# Patient Record
Sex: Female | Born: 1945 | Race: White | Hispanic: No | Smoking: Former smoker
Health system: Southern US, Community
[De-identification: ages and names within clinical notes are randomized; demographics above are authoritative.]

## PROBLEM LIST (undated history)

## (undated) DIAGNOSIS — J189 Pneumonia, unspecified organism: Secondary | ICD-10-CM

## (undated) DIAGNOSIS — Z860100 Personal history of colon polyps, unspecified: Secondary | ICD-10-CM

## (undated) DIAGNOSIS — R531 Weakness: Secondary | ICD-10-CM

## (undated) DIAGNOSIS — G8929 Other chronic pain: Secondary | ICD-10-CM

## (undated) DIAGNOSIS — K219 Gastro-esophageal reflux disease without esophagitis: Secondary | ICD-10-CM

## (undated) DIAGNOSIS — R0600 Dyspnea, unspecified: Secondary | ICD-10-CM

## (undated) DIAGNOSIS — Z9889 Other specified postprocedural states: Secondary | ICD-10-CM

## (undated) DIAGNOSIS — M1990 Unspecified osteoarthritis, unspecified site: Secondary | ICD-10-CM

## (undated) DIAGNOSIS — L853 Xerosis cutis: Secondary | ICD-10-CM

## (undated) DIAGNOSIS — M2550 Pain in unspecified joint: Secondary | ICD-10-CM

## (undated) DIAGNOSIS — R112 Nausea with vomiting, unspecified: Secondary | ICD-10-CM

## (undated) DIAGNOSIS — Z9289 Personal history of other medical treatment: Secondary | ICD-10-CM

## (undated) DIAGNOSIS — I499 Cardiac arrhythmia, unspecified: Secondary | ICD-10-CM

## (undated) DIAGNOSIS — I4891 Unspecified atrial fibrillation: Secondary | ICD-10-CM

## (undated) DIAGNOSIS — M549 Dorsalgia, unspecified: Secondary | ICD-10-CM

## (undated) DIAGNOSIS — R351 Nocturia: Secondary | ICD-10-CM

## (undated) HISTORY — PX: OTHER SURGICAL HISTORY: SHX169

## (undated) HISTORY — PX: COLONOSCOPY: SHX174

## (undated) HISTORY — PX: HIP SURGERY: SHX245

## (undated) HISTORY — PX: CHOLECYSTECTOMY: SHX55

## (undated) HISTORY — PX: ESOPHAGOGASTRODUODENOSCOPY: SHX1529

---

## 1997-06-20 ENCOUNTER — Other Ambulatory Visit: Admission: RE | Admit: 1997-06-20 | Discharge: 1997-06-20 | Payer: Self-pay | Admitting: *Deleted

## 1998-12-06 ENCOUNTER — Encounter: Payer: Self-pay | Admitting: Obstetrics and Gynecology

## 1998-12-06 ENCOUNTER — Encounter: Admission: RE | Admit: 1998-12-06 | Discharge: 1998-12-06 | Payer: Self-pay | Admitting: Obstetrics and Gynecology

## 1999-01-08 ENCOUNTER — Other Ambulatory Visit: Admission: RE | Admit: 1999-01-08 | Discharge: 1999-01-08 | Payer: Self-pay | Admitting: *Deleted

## 1999-02-18 ENCOUNTER — Encounter: Admission: RE | Admit: 1999-02-18 | Discharge: 1999-02-18 | Payer: Self-pay | Admitting: Obstetrics and Gynecology

## 1999-02-18 ENCOUNTER — Encounter: Payer: Self-pay | Admitting: Obstetrics and Gynecology

## 2000-09-17 ENCOUNTER — Encounter: Admission: RE | Admit: 2000-09-17 | Discharge: 2000-09-17 | Payer: Self-pay | Admitting: Gastroenterology

## 2000-09-17 ENCOUNTER — Encounter: Payer: Self-pay | Admitting: Gastroenterology

## 2000-11-17 ENCOUNTER — Encounter: Admission: RE | Admit: 2000-11-17 | Discharge: 2000-11-17 | Payer: Self-pay | Admitting: Internal Medicine

## 2000-11-17 ENCOUNTER — Encounter: Payer: Self-pay | Admitting: Internal Medicine

## 2001-11-23 ENCOUNTER — Encounter: Payer: Self-pay | Admitting: Internal Medicine

## 2001-11-23 ENCOUNTER — Encounter: Admission: RE | Admit: 2001-11-23 | Discharge: 2001-11-23 | Payer: Self-pay | Admitting: Internal Medicine

## 2001-12-09 ENCOUNTER — Other Ambulatory Visit: Admission: RE | Admit: 2001-12-09 | Discharge: 2001-12-09 | Payer: Self-pay | Admitting: Internal Medicine

## 2002-02-17 ENCOUNTER — Encounter (INDEPENDENT_AMBULATORY_CARE_PROVIDER_SITE_OTHER): Payer: Self-pay | Admitting: *Deleted

## 2002-02-17 ENCOUNTER — Ambulatory Visit (HOSPITAL_BASED_OUTPATIENT_CLINIC_OR_DEPARTMENT_OTHER): Admission: RE | Admit: 2002-02-17 | Discharge: 2002-02-17 | Payer: Self-pay | Admitting: General Surgery

## 2002-11-29 ENCOUNTER — Encounter: Admission: RE | Admit: 2002-11-29 | Discharge: 2002-11-29 | Payer: Self-pay | Admitting: Internal Medicine

## 2003-04-20 ENCOUNTER — Emergency Department (HOSPITAL_COMMUNITY): Admission: EM | Admit: 2003-04-20 | Discharge: 2003-04-20 | Payer: Self-pay | Admitting: Emergency Medicine

## 2003-05-01 ENCOUNTER — Emergency Department (HOSPITAL_COMMUNITY): Admission: EM | Admit: 2003-05-01 | Discharge: 2003-05-01 | Payer: Self-pay | Admitting: Emergency Medicine

## 2003-11-30 ENCOUNTER — Encounter: Admission: RE | Admit: 2003-11-30 | Discharge: 2003-11-30 | Payer: Self-pay | Admitting: Internal Medicine

## 2004-08-13 ENCOUNTER — Other Ambulatory Visit: Admission: RE | Admit: 2004-08-13 | Discharge: 2004-08-13 | Payer: Self-pay | Admitting: Internal Medicine

## 2004-09-23 ENCOUNTER — Ambulatory Visit (HOSPITAL_COMMUNITY): Admission: RE | Admit: 2004-09-23 | Discharge: 2004-09-24 | Payer: Self-pay | Admitting: Surgery

## 2004-09-23 ENCOUNTER — Encounter (INDEPENDENT_AMBULATORY_CARE_PROVIDER_SITE_OTHER): Payer: Self-pay | Admitting: Specialist

## 2004-11-19 ENCOUNTER — Other Ambulatory Visit: Admission: RE | Admit: 2004-11-19 | Discharge: 2004-11-19 | Payer: Self-pay | Admitting: Internal Medicine

## 2004-12-03 ENCOUNTER — Encounter: Admission: RE | Admit: 2004-12-03 | Discharge: 2004-12-03 | Payer: Self-pay | Admitting: Internal Medicine

## 2006-11-04 ENCOUNTER — Encounter: Admission: RE | Admit: 2006-11-04 | Discharge: 2006-11-04 | Payer: Self-pay | Admitting: Hospitalist

## 2007-11-11 ENCOUNTER — Ambulatory Visit (HOSPITAL_BASED_OUTPATIENT_CLINIC_OR_DEPARTMENT_OTHER): Admission: RE | Admit: 2007-11-11 | Discharge: 2007-11-11 | Payer: Self-pay | Admitting: Internal Medicine

## 2008-11-09 ENCOUNTER — Ambulatory Visit (HOSPITAL_BASED_OUTPATIENT_CLINIC_OR_DEPARTMENT_OTHER): Admission: RE | Admit: 2008-11-09 | Discharge: 2008-11-09 | Payer: Self-pay | Admitting: Internal Medicine

## 2008-11-09 ENCOUNTER — Ambulatory Visit: Payer: Self-pay | Admitting: Radiology

## 2009-11-27 ENCOUNTER — Ambulatory Visit (HOSPITAL_BASED_OUTPATIENT_CLINIC_OR_DEPARTMENT_OTHER): Admission: RE | Admit: 2009-11-27 | Discharge: 2009-11-27 | Payer: Self-pay | Admitting: Internal Medicine

## 2009-11-27 ENCOUNTER — Ambulatory Visit: Payer: Self-pay | Admitting: Diagnostic Radiology

## 2010-02-23 ENCOUNTER — Encounter: Payer: Self-pay | Admitting: Internal Medicine

## 2010-06-21 NOTE — Op Note (Signed)
   NAME:  [name] Dodson, [name] Dodson                            ACCOUNT NO.:  [id number]   MEDICAL RECORD NO.:  [id number]                   PATIENT TYPE:  AMB   LOCATION:  DSC                                  FACILITY:  [facility]   PHYSICIAN:  Ollen Gross. Vernell Morgans, M.D.              DATE OF BIRTH:  1945-12-27   DATE OF PROCEDURE:  02/17/2002  DATE OF DISCHARGE:                                 OPERATIVE REPORT   PREOPERATIVE DIAGNOSES:  Lipoma of the right low back.   POSTOPERATIVE DIAGNOSES:  Lipoma of the right low back.   OPERATION PERFORMED:  Excision of lipoma from the right low back 3 cm  approximately.   SURGEON:  Ollen Gross. Carolynne Edouard, M.D.   ANESTHESIA:  Local.   DESCRIPTION OF PROCEDURE:  After informed consent was obtained, the patient  was brought to the operating room and placed in prone position on the  operating able.  The area in question was prepped with Betadine and draped  in the usual sterile manner.  The area was then infiltrated with 1%  lidocaine with epinephrine until adequate anesthesia had been obtained.  A  small transverse incision was made overlying the mass.  This incision was  carried down into the subcutaneous tissue sharply with a 10 blade knife.  Blunt dissection with a hemostat and sharp dissection with a scissors was  then carried out until the mass in question was separated from the rest of  the subcutaneous fat.  The mass was then removed and sent to pathology.  The  deep layer of the wound was then examined and found to be hemostatic.  The  deep layer was closed with interrupted 3-0 Vicryl stitches.  The skin was  closed with a running 4-0 Monocryl subcuticular stitch.  Benzoin, Steri-  Strips and sterile dressings were applied.  The patient tolerated the  procedure well.  At the end of the case all sponge, needle and instrument  counts were correct.  The patient was then taken to the recovery room in  stable condition.           Ollen Gross. Vernell Morgans, M.D.    PST/MEDQ  D:  02/17/2002  T:  02/17/2002  Job:  [id number]

## 2010-06-21 NOTE — Op Note (Signed)
NAME:  [name] Dodson, [name] Dodson                  ACCOUNT NO.:  [id number]   MEDICAL RECORD NO.:  [id number]          PATIENT TYPE:  AMB   LOCATION:  DAY                          [facility]ACILITY:  [facility]   PHYSICIAN:  Currie Paris, M.D.DATE OF BIRTH:  Aug 16, 1945   DATE OF PROCEDURE:  09/23/2004  DATE OF DISCHARGE:                                 OPERATIVE REPORT   CCS#:  [id number].   PREOPERATIVE DIAGNOSIS:  Chronic calculus cholecystitis.   POSTOPERATIVE DIAGNOSIS:  Chronic calculus cholecystitis.   OPERATION:  Laparoscopic cholecystectomy with operative cholangiogram.   SURGEON:  Dr. Cyndia Bent.   ASSISTANT:  Dr. Velora Heckler.   ANESTHESIA:  General endotracheal.   CLINICAL HISTORY:  This is a 65 year old lady who has got biliary type  symptoms and known gallstones. She elected to proceed to cholecystectomy.   DESCRIPTION OF PROCEDURE:  The patient was seen in the holding area and she  had no further questions. She was taken to the operating room and the  abdomen was prepped and draped. The time-out occurred.   The area of the umbilicus was infiltrated with 0.25% plain Marcaine,  incision made, the fascia identified, opened and the peroneal cavity entered  under direct vision. A pursestring was placed, the Hasson introduced and the  abdomen insufflated to 15.   With the patient in reverse Trendelenburg and tilted to the left, additional  trocars placed in the epigastrium and right upper quadrant.   There were multiple omental adhesions to the entire length of the  gallbladder and these were taken down with a combination of sharp and  cautery dissection. Once I got down to the ampulla of the gallbladder with  some lateral retraction, I was able to dissect out and identified the cystic  duct which was about a diameter of a clip, a little larger than normal. To  make sure we had in the anatomy correctly identified, I opened up the  peritoneum going on either side of the gallbladder.  We identified the  posterior branch of the cystic artery which was clipped at this point and  made a large window to make sure that there were no other structures.   Once the anatomy appeared to be clear, I put a clip across the cystic duct  at the gallbladder and opened it. A Cook catheter was introduced and held  with a clip although there was a little bit of leakage around it.   We did several cholangiograms. Initially we did not see much contrast go  into to the duodenum bit after some glucagon saw filling of the duodenum. We  had what looked like a long cystic duct segment, filling of the hepatic  radicals and no obvious stones.   The catheter was removed and three clips placed across the cystic duct  taking care to that they completely crossed the duct and it was divided.  Another branch of the cystic artery was identified, clipped and divided. The  gallbladder was removed from below to above with an additional clip used on  a small blood vessel in the bed  of the gallbladder.   Once the gallbladder was disconnected, it was put in a bag. We spent several  minutes irrigating making sure everything was completely dry. Once we  thought hemostasis was achieved, brought the gallbladder out the umbilical  port. The port was irrigated for a final irrigation and hemostasis check and  again everything appeared to be dry. The lateral ports removed under direct  vision and there was no bleeding. The umbilical site was closed with a  pursestring. The abdomen was deflated through the epigastric site. The skin  was closed with 4-0 Monocryl, subcuticular and Dermabond.   The patient tolerated the procedure well. There were no operative  complications. All counts were correct.      Currie Paris, M.D.  Electronically Signed     CJS/MEDQ  D:  09/23/2004  T:  09/23/2004  Job:  [id number]   cc:   Sharlet Salina, M.D.  [address]  [place]  [place] [address]  Fax: [phone]

## 2010-10-24 ENCOUNTER — Other Ambulatory Visit (HOSPITAL_BASED_OUTPATIENT_CLINIC_OR_DEPARTMENT_OTHER): Payer: Self-pay | Admitting: Internal Medicine

## 2010-10-24 DIAGNOSIS — Z1231 Encounter for screening mammogram for malignant neoplasm of breast: Secondary | ICD-10-CM

## 2010-11-29 ENCOUNTER — Ambulatory Visit (HOSPITAL_BASED_OUTPATIENT_CLINIC_OR_DEPARTMENT_OTHER): Payer: Self-pay

## 2010-12-11 ENCOUNTER — Ambulatory Visit (HOSPITAL_BASED_OUTPATIENT_CLINIC_OR_DEPARTMENT_OTHER)
Admission: RE | Admit: 2010-12-11 | Discharge: 2010-12-11 | Disposition: A | Discharge status: Home / Self Care | Payer: Medicare Other | Source: Ambulatory Visit | Attending: Internal Medicine | Admitting: Internal Medicine

## 2010-12-11 DIAGNOSIS — Z1231 Encounter for screening mammogram for malignant neoplasm of breast: Secondary | ICD-10-CM

## 2011-11-18 ENCOUNTER — Other Ambulatory Visit (HOSPITAL_BASED_OUTPATIENT_CLINIC_OR_DEPARTMENT_OTHER): Payer: Self-pay | Admitting: *Deleted

## 2011-11-18 DIAGNOSIS — Z1231 Encounter for screening mammogram for malignant neoplasm of breast: Secondary | ICD-10-CM

## 2011-12-12 ENCOUNTER — Ambulatory Visit (HOSPITAL_BASED_OUTPATIENT_CLINIC_OR_DEPARTMENT_OTHER)
Admission: RE | Admit: 2011-12-12 | Discharge: 2011-12-12 | Disposition: A | Discharge status: Home / Self Care | Payer: Medicare Other | Source: Ambulatory Visit | Attending: Family Medicine | Admitting: Family Medicine

## 2011-12-12 DIAGNOSIS — R928 Other abnormal and inconclusive findings on diagnostic imaging of breast: Secondary | ICD-10-CM | POA: Insufficient documentation

## 2011-12-12 DIAGNOSIS — Z1231 Encounter for screening mammogram for malignant neoplasm of breast: Secondary | ICD-10-CM | POA: Insufficient documentation

## 2011-12-18 ENCOUNTER — Other Ambulatory Visit: Payer: Self-pay | Admitting: *Deleted

## 2011-12-18 DIAGNOSIS — R928 Other abnormal and inconclusive findings on diagnostic imaging of breast: Secondary | ICD-10-CM

## 2012-01-08 ENCOUNTER — Other Ambulatory Visit: Payer: Self-pay | Admitting: Family Medicine

## 2012-01-08 ENCOUNTER — Ambulatory Visit
Admission: RE | Admit: 2012-01-08 | Discharge: 2012-01-08 | Disposition: A | Discharge status: Home / Self Care | Payer: Medicare Other | Source: Ambulatory Visit | Attending: *Deleted | Admitting: *Deleted

## 2012-01-08 DIAGNOSIS — R928 Other abnormal and inconclusive findings on diagnostic imaging of breast: Secondary | ICD-10-CM

## 2012-01-08 DIAGNOSIS — R921 Mammographic calcification found on diagnostic imaging of breast: Secondary | ICD-10-CM

## 2012-01-12 ENCOUNTER — Ambulatory Visit
Admission: RE | Admit: 2012-01-12 | Discharge: 2012-01-12 | Disposition: A | Discharge status: Home / Self Care | Payer: Medicare Other | Source: Ambulatory Visit | Attending: Family Medicine | Admitting: Family Medicine

## 2012-01-12 ENCOUNTER — Other Ambulatory Visit: Payer: Self-pay | Admitting: Family Medicine

## 2012-01-12 DIAGNOSIS — R921 Mammographic calcification found on diagnostic imaging of breast: Secondary | ICD-10-CM

## 2013-06-02 ENCOUNTER — Other Ambulatory Visit: Payer: Self-pay

## 2013-06-02 DIAGNOSIS — Z1231 Encounter for screening mammogram for malignant neoplasm of breast: Secondary | ICD-10-CM

## 2013-06-13 ENCOUNTER — Ambulatory Visit
Admission: RE | Admit: 2013-06-13 | Discharge: 2013-06-13 | Disposition: A | Discharge status: Home / Self Care | Payer: Medicare Other | Source: Ambulatory Visit

## 2013-06-13 DIAGNOSIS — Z1231 Encounter for screening mammogram for malignant neoplasm of breast: Secondary | ICD-10-CM

## 2013-06-14 ENCOUNTER — Other Ambulatory Visit: Payer: Self-pay | Admitting: Family Medicine

## 2013-06-14 DIAGNOSIS — R928 Other abnormal and inconclusive findings on diagnostic imaging of breast: Secondary | ICD-10-CM

## 2013-06-24 ENCOUNTER — Other Ambulatory Visit: Payer: Medicare Other

## 2013-07-06 ENCOUNTER — Other Ambulatory Visit: Payer: Medicare Other

## 2013-07-11 ENCOUNTER — Other Ambulatory Visit: Payer: Self-pay | Admitting: Physical Medicine and Rehabilitation

## 2013-07-11 ENCOUNTER — Other Ambulatory Visit: Payer: Self-pay | Admitting: Family Medicine

## 2013-07-11 DIAGNOSIS — R928 Other abnormal and inconclusive findings on diagnostic imaging of breast: Secondary | ICD-10-CM

## 2013-07-27 ENCOUNTER — Other Ambulatory Visit: Payer: Self-pay | Admitting: Physical Medicine and Rehabilitation

## 2013-07-27 DIAGNOSIS — R928 Other abnormal and inconclusive findings on diagnostic imaging of breast: Secondary | ICD-10-CM

## 2013-08-09 ENCOUNTER — Other Ambulatory Visit: Payer: Self-pay | Admitting: Family Medicine

## 2013-08-09 DIAGNOSIS — R928 Other abnormal and inconclusive findings on diagnostic imaging of breast: Secondary | ICD-10-CM

## 2013-08-22 ENCOUNTER — Ambulatory Visit
Admission: RE | Admit: 2013-08-22 | Discharge: 2013-08-22 | Disposition: A | Discharge status: Home / Self Care | Payer: Medicare Other | Source: Ambulatory Visit | Attending: Family Medicine | Admitting: Family Medicine

## 2013-08-22 DIAGNOSIS — R928 Other abnormal and inconclusive findings on diagnostic imaging of breast: Secondary | ICD-10-CM

## 2013-08-26 ENCOUNTER — Other Ambulatory Visit: Payer: Medicare Other

## 2013-09-02 ENCOUNTER — Other Ambulatory Visit: Payer: Medicare Other

## 2013-09-02 ENCOUNTER — Ambulatory Visit: Payer: Medicare Other | Admitting: Family Medicine

## 2014-07-11 ENCOUNTER — Other Ambulatory Visit: Payer: Self-pay

## 2014-07-11 DIAGNOSIS — Z1231 Encounter for screening mammogram for malignant neoplasm of breast: Secondary | ICD-10-CM

## 2014-07-21 ENCOUNTER — Ambulatory Visit
Admission: RE | Admit: 2014-07-21 | Discharge: 2014-07-21 | Disposition: A | Discharge status: Home / Self Care | Payer: Medicare Other | Source: Ambulatory Visit

## 2014-07-21 DIAGNOSIS — Z1231 Encounter for screening mammogram for malignant neoplasm of breast: Secondary | ICD-10-CM

## 2015-07-03 ENCOUNTER — Other Ambulatory Visit (HOSPITAL_BASED_OUTPATIENT_CLINIC_OR_DEPARTMENT_OTHER): Payer: Self-pay | Admitting: Family Medicine

## 2015-07-03 DIAGNOSIS — Z1231 Encounter for screening mammogram for malignant neoplasm of breast: Secondary | ICD-10-CM

## 2015-07-31 ENCOUNTER — Ambulatory Visit (HOSPITAL_BASED_OUTPATIENT_CLINIC_OR_DEPARTMENT_OTHER)
Admission: RE | Admit: 2015-07-31 | Discharge: 2015-07-31 | Disposition: A | Discharge status: Home / Self Care | Payer: Medicare Other | Source: Ambulatory Visit | Attending: Family Medicine | Admitting: Family Medicine

## 2015-07-31 DIAGNOSIS — Z1231 Encounter for screening mammogram for malignant neoplasm of breast: Secondary | ICD-10-CM | POA: Insufficient documentation

## 2016-01-30 ENCOUNTER — Other Ambulatory Visit: Payer: Self-pay | Admitting: Orthopedic Surgery

## 2016-02-07 ENCOUNTER — Other Ambulatory Visit: Payer: Self-pay | Admitting: Orthopedic Surgery

## 2016-02-12 ENCOUNTER — Encounter (HOSPITAL_COMMUNITY)
Admission: RE | Admit: 2016-02-12 | Discharge: 2016-02-12 | Disposition: A | Discharge status: Home / Self Care | Payer: Medicare Other | Source: Ambulatory Visit | Attending: Orthopedic Surgery | Admitting: Orthopedic Surgery

## 2016-02-12 ENCOUNTER — Ambulatory Visit (HOSPITAL_COMMUNITY)
Admission: RE | Admit: 2016-02-12 | Discharge: 2016-02-12 | Disposition: A | Discharge status: Home / Self Care | Payer: Medicare Other | Source: Ambulatory Visit | Attending: Orthopedic Surgery | Admitting: Orthopedic Surgery

## 2016-02-12 ENCOUNTER — Encounter (HOSPITAL_COMMUNITY): Payer: Self-pay

## 2016-02-12 DIAGNOSIS — I10 Essential (primary) hypertension: Secondary | ICD-10-CM | POA: Diagnosis not present

## 2016-02-12 DIAGNOSIS — J9811 Atelectasis: Secondary | ICD-10-CM | POA: Insufficient documentation

## 2016-02-12 DIAGNOSIS — I517 Cardiomegaly: Secondary | ICD-10-CM | POA: Diagnosis not present

## 2016-02-12 DIAGNOSIS — Z01818 Encounter for other preprocedural examination: Secondary | ICD-10-CM | POA: Insufficient documentation

## 2016-02-12 DIAGNOSIS — Z9049 Acquired absence of other specified parts of digestive tract: Secondary | ICD-10-CM | POA: Diagnosis not present

## 2016-02-12 DIAGNOSIS — Q2546 Tortuous aortic arch: Secondary | ICD-10-CM | POA: Insufficient documentation

## 2016-02-12 DIAGNOSIS — Z01810 Encounter for preprocedural cardiovascular examination: Secondary | ICD-10-CM | POA: Diagnosis present

## 2016-02-12 HISTORY — DX: Pain in unspecified joint: M25.50

## 2016-02-12 HISTORY — DX: Dorsalgia, unspecified: M54.9

## 2016-02-12 HISTORY — DX: Pneumonia, unspecified organism: J18.9

## 2016-02-12 HISTORY — DX: Personal history of colon polyps, unspecified: Z86.0100

## 2016-02-12 HISTORY — DX: Nocturia: R35.1

## 2016-02-12 HISTORY — DX: Other chronic pain: G89.29

## 2016-02-12 HISTORY — DX: Unspecified osteoarthritis, unspecified site: M19.90

## 2016-02-12 HISTORY — DX: Other specified postprocedural states: Z98.890

## 2016-02-12 HISTORY — DX: Unspecified atrial fibrillation: I48.91

## 2016-02-12 HISTORY — DX: Gastro-esophageal reflux disease without esophagitis: K21.9

## 2016-02-12 HISTORY — DX: Weakness: R53.1

## 2016-02-12 HISTORY — DX: Nausea with vomiting, unspecified: R11.2

## 2016-02-12 HISTORY — DX: Personal history of colonic polyps: Z86.010

## 2016-02-12 HISTORY — DX: Xerosis cutis: L85.3

## 2016-02-12 HISTORY — DX: Personal history of other medical treatment: Z92.89

## 2016-02-12 LAB — PROTIME-INR
INR: 0.99
Prothrombin Time: 13.1 seconds (ref 11.4–15.2)

## 2016-02-12 LAB — URINALYSIS, ROUTINE W REFLEX MICROSCOPIC
Bilirubin Urine: NEGATIVE
Glucose, UA: NEGATIVE mg/dL
HGB URINE DIPSTICK: NEGATIVE
Ketones, ur: NEGATIVE mg/dL
LEUKOCYTES UA: NEGATIVE
NITRITE: NEGATIVE
PROTEIN: NEGATIVE mg/dL
SPECIFIC GRAVITY, URINE: 1.023 (ref 1.005–1.030)
pH: 5 (ref 5.0–8.0)

## 2016-02-12 LAB — COMPREHENSIVE METABOLIC PANEL
ALBUMIN: 4.1 g/dL (ref 3.5–5.0)
ALK PHOS: 62 U/L (ref 38–126)
ALT: 16 U/L (ref 14–54)
ANION GAP: 9 (ref 5–15)
AST: 22 U/L (ref 15–41)
BUN: 13 mg/dL (ref 6–20)
CHLORIDE: 105 mmol/L (ref 101–111)
CO2: 24 mmol/L (ref 22–32)
Calcium: 9.7 mg/dL (ref 8.9–10.3)
Creatinine, Ser: 0.82 mg/dL (ref 0.44–1.00)
GFR calc non Af Amer: >60 mL/min (ref >60)
GLUCOSE: 119 mg/dL — AB (ref 65–99)
Potassium: 3.4 mmol/L — ABNORMAL LOW (ref 3.5–5.1)
SODIUM: 138 mmol/L (ref 135–145)
Total Bilirubin: 0.5 mg/dL (ref 0.3–1.2)
Total Protein: 7.5 g/dL (ref 6.5–8.1)

## 2016-02-12 LAB — CBC WITH DIFFERENTIAL/PLATELET
BASOS PCT: 0 %
Basophils Absolute: 0 10*3/uL (ref 0.0–0.1)
EOS ABS: 0.1 10*3/uL (ref 0.0–0.7)
EOS PCT: 2 %
HCT: 40.1 % (ref 36.0–46.0)
HEMOGLOBIN: 14.3 g/dL (ref 12.0–15.0)
LYMPHS ABS: 2.9 10*3/uL (ref 0.7–4.0)
Lymphocytes Relative: 31 %
MCH: 35.5 pg — AB (ref 26.0–34.0)
MCHC: 35.7 g/dL (ref 30.0–36.0)
MCV: 99.5 fL (ref 78.0–100.0)
MONOS PCT: 7 %
Monocytes Absolute: 0.7 10*3/uL (ref 0.1–1.0)
NEUTROS PCT: 60 %
Neutro Abs: 5.6 10*3/uL (ref 1.7–7.7)
PLATELETS: 253 10*3/uL (ref 150–400)
RBC: 4.03 MIL/uL (ref 3.87–5.11)
RDW: 12.8 % (ref 11.5–15.5)
WBC: 9.4 10*3/uL (ref 4.0–10.5)

## 2016-02-12 LAB — SURGICAL PCR SCREEN
MRSA, PCR: NEGATIVE
Staphylococcus aureus: POSITIVE — AB

## 2016-02-12 LAB — APTT: aPTT: 36 seconds (ref 24–36)

## 2016-02-12 MED ORDER — POVIDONE-IODINE 7.5 % EX SOLN
Freq: Once | CUTANEOUS | Status: DC
  Filled 2016-02-12: qty 118

## 2016-02-12 NOTE — Progress Notes (Signed)
[place] [facility] [place]ment [place] [facility] [place].

## 2016-02-12 NOTE — Pre-Procedure Instructions (Signed)
[name] Dodson  02/12/2016      [id number] - [place], [place] - [address] AT [facility] OF [facility] & [address] [place] [address] Phone: [phone] Fax: [phone]    Your procedure is scheduled on Thurs, Jan 11 @ 1:00 PM  Report to [facility] Admitting at 10:00 AM  Call this number if you have problems the morning of surgery:  [phone]   Remember:  Do not eat food or drink liquids after midnight.  Take these medicines the morning of surgery with A SIP OF WATER Flecainide(Tambocor) and Pain Pill(if needed)             Stop taking your Aspirin.  No Goody's,BC's,Aleve,Advil,Motrin,Ibuprofen,Fish Oil,or any Herbal Medications.    Do not wear jewelry, make-up or nail polish.  Do not wear lotions, powders, perfumes, or deoderant.  Do not shave 48 hours prior to surgery.    Do not bring valuables to the hospital.  [facility] is not responsible for any belongings or valuables.  Contacts, dentures or bridgework may not be worn into surgery.  Leave your suitcase in the car.  After surgery it may be brought to your room.  For patients admitted to the hospital, discharge time will be determined by your treatment team.  Patients discharged the day of surgery will not be allowed to drive home.    [facility]  Before surgery, you can play an important role.  Because skin is not sterile, your skin needs to be as free of germs as possible.  You can reduce the number of germs on you skin by washing with CHG (chlorahexidine gluconate) soap before surgery.  CHG is an antiseptic cleaner which kills germs and bonds with the skin to continue killing germs even after washing.  Please DO NOT use if you have an allergy to CHG or antibacterial soaps.  If your skin becomes reddened/irritated stop using the CHG and inform your nurse when you arrive at Short Stay.  Do not shave (including  legs and underarms) for at least 48 hours prior to the first CHG shower.  You may shave your face.  Please follow these instructions carefully:   1.  Shower with CHG Soap the night before surgery and the                                morning of Surgery.  2.  If you choose to wash your hair, wash your hair first as usual with your       normal shampoo.  3.  After you shampoo, rinse your hair and body thoroughly to remove the                      Shampoo.  4.  Use CHG as you would any other liquid soap.  You can apply chg directly       to the skin and wash gently with scrungie or a clean washcloth.  5.  Apply the CHG Soap to your body ONLY FROM THE NECK DOWN.        Do not use on open wounds or open sores.  Avoid contact with your eyes,       ears, mouth and genitals (private parts).  Wash genitals (private parts)       with your normal  soap.  6.  Wash thoroughly, paying special attention to the area where your surgery        will be performed.  7.  Thoroughly rinse your body with warm water from the neck down.  8.  DO NOT shower/wash with your normal soap after using and rinsing off       the CHG Soap.  9.  Pat yourself dry with a clean towel.            10.  Wear clean pajamas.            11.  Place clean sheets on your bed the night of your first shower and do not        sleep with pets.  Day of Surgery  Do not apply any lotions/deoderants the morning of surgery.  Please wear clean clothes to the hospital/surgery center.     Please read over the following fact sheets that you were given. Pain Booklet, Coughing and Deep Breathing, MRSA Information and Surgical Site Infection Prevention

## 2016-02-12 NOTE — Progress Notes (Signed)
Dr.Ker An[place]e [place]Boyce in [place] with last visit in Oct 2017 to request  [facility] Oct 2017  Stress test > 10 yrs ago  Heart cath done > 10 yrs ago  [facility] in Oct 2017  Denies CXR in past couple of yr

## 2016-02-13 ENCOUNTER — Encounter (HOSPITAL_COMMUNITY): Payer: Self-pay

## 2016-02-13 MED ORDER — CEFAZOLIN SODIUM-DEXTROSE 2-4 GM/100ML-% IV SOLN
2.0000 g | INTRAVENOUS | Status: AC
  Administered 2016-02-14: 2 g via INTRAVENOUS
  Filled 2016-02-13: qty 100

## 2016-02-13 NOTE — Progress Notes (Signed)
Anesthesia Chart Review: Patient is a 71 year old female scheduled for L3-4 decompression on 02/14/16 by Dr. Yevette Edwardsumonski.  History includes former smoker, post-operative N/V, afib 04/08/08 s/p DCCV 05/24/09 s/p CTI ablation 10/02/09 with recurrent afib 2016 s/p DCCV 10/27/14 (CHADS2 score is 0, so currently back on ASA only), GERD, depression, arthritis, chronic back pain with N/T RLE, cholecystectomy '06, right partial hip replacement, right back lipoma excision '04.  No PCP per patient. Cardiologist is Dr. Kandice RobinsonsKer Boyce in [place], last visit 05/28/15. She was back in SR and on flecainide following DCCV 10/27/14. One year follow-up recommended.  Meds include aspirin 81 mg, flecainide, Norco, Percocet.  BP (!) 160/71   Pulse 69   Temp 36.5 C   Resp 20   Ht 5\' 8"  (1.727 m)   Wt 195 lb 11.2 oz (88.8 kg)   SpO2 98%   BMI 29.76 kg/m    EKG 05/28/15 (Dr. Annye AsaBoyce): Sinus rhythm.  Echo 09/21/14: Conclusions: The study quality is technically difficult. Atrial fibrillation. Estimated EF 60-65%. Left atrium is mildly dilated. Aortic valve is trilleaflet. Mild aortic cusp sclerosis is present.   She reported last cath and stress test were > 10 years ago; however, according to 05/28/15 office note from Dr. Annye AsaBoyce: - Cardiac cath 04/11/09: EF 50%, normal coronary arteries.   CXR 02/12/16: IMPRESSION: Borderline cardiomegaly.  Bibasilar atelectasis/scarring.  Preoperative labs noted.   If no acute changes then I would anticipate that she can proceed as planned.  [name] Ochsllison Percell Lamboy, PA-C [facility]iology Phone [phone] 02/13/2016 4:13 PM

## 2016-02-13 NOTE — H&P (Signed)
     PREOPERATIVE H&P  Chief Complaint: Right leg pain  HPI: [name] SpearingCarol R Dodson is a [age] y.o. female who presents with ongoing pain in the right leg  MRI reveals a foramenal R L3/4 HNP, comressing the R L3 nerve  Patient has failed multiple forms of conservative care and continues to have pain (see office notes for additional details regarding the patient's full course of treatment)  Past Medical History:  Diagnosis Date  . Arthritis   . Atrial fibrillation (HCC)    afib 04/08/08 s/p DCCV 05/05/09, s/p CTI ablation 10/02/09; recurrent afib 2016 s/p DCCV 10/27/14. On flecainide, ASA. (Dr. Kandice RobinsonsKer Boyce)  . Chronic back pain    stenosis  . Depression    not on any meds  . Dry skin   . GERD (gastroesophageal reflux disease)    Tums daily  . History of blood transfusion    no abnormal reaction  . History of colon polyps    benign  . Joint pain   . Nocturia   . Pneumonia    hx of  . PONV (postoperative nausea and vomiting)   . Weakness    numbness and tingling in right leg/foot   Past Surgical History:  Procedure Laterality Date  . cataract surgery Bilateral   . CHOLECYSTECTOMY    . COLONOSCOPY    . ESOPHAGOGASTRODUODENOSCOPY    . heart ablation    . HIP SURGERY Right    with plates/screws-partial replacement   Social History   Social History  . Marital status: Widowed    Spouse name: N/A  . Number of children: N/A  . Years of education: N/A   Social History Main Topics  . Smoking status: Former Games developermoker  . Smokeless tobacco: Never Used     Comment: quit smoking 14+ yrs ago  . Alcohol use Yes     Comment: rarely  . Drug use: No  . Sexual activity: Not on file   Other Topics Concern  . Not on file   Social History Narrative  . No narrative on file   No family history on file. No Known Allergies Prior to Admission medications   Medication Sig Start Date End Date Taking? Authorizing Provider  aspirin EC 81 MG tablet Take 81 mg by mouth 2 (two) times a week.   Yes  Historical Provider, MD  flecainide (TAMBOCOR) 100 MG tablet Take 100 mg by mouth 2 (two) times daily.   Yes Historical Provider, MD  HYDROcodone-acetaminophen (NORCO) 7.5-325 MG tablet Take 1 tablet by mouth every 6 (six) hours as needed for moderate pain.   Yes Historical Provider, MD  oxyCODONE-acetaminophen (PERCOCET/ROXICET) 5-325 MG tablet Take 1 tablet by mouth every 6 (six) hours as needed for severe pain.   Yes Historical Provider, MD     All other systems have been reviewed and were otherwise negative with the exception of those mentioned in the HPI and as above.  Physical Exam: There were no vitals filed for this visit.  General: Alert, no acute distress Cardiovascular: No pedal edema Respiratory: No cyanosis, no use of accessory musculature Skin: No lesions in the area of chief complaint Neurologic: Sensation intact distally Psychiatric: Patient is competent for consent with normal mood and affect Lymphatic: No axillary or cervical lymphadenopathy  MUSCULOSKELETAL: 3/5 R quadriceps strength  Assessment/Plan: Right L3 Radiculopathy   Plan for Procedure(s): LUMBAR 3-4 DECOMPRESSION   Emilee HeroUMONSKI,Arlind Klingerman LEONARD, MD 02/13/2016 7:45 PM

## 2016-02-14 ENCOUNTER — Encounter (HOSPITAL_COMMUNITY)
Admission: RE | Disposition: A | Discharge status: Home / Self Care | Payer: Self-pay | Source: Ambulatory Visit | Attending: Orthopedic Surgery

## 2016-02-14 ENCOUNTER — Ambulatory Visit (HOSPITAL_COMMUNITY): Payer: Medicare Other

## 2016-02-14 ENCOUNTER — Encounter (HOSPITAL_COMMUNITY): Payer: Self-pay | Admitting: *Deleted

## 2016-02-14 ENCOUNTER — Ambulatory Visit (HOSPITAL_COMMUNITY)
Admission: RE | Admit: 2016-02-14 | Discharge: 2016-02-14 | Disposition: A | Discharge status: Home / Self Care | Payer: Medicare Other | Source: Ambulatory Visit | Attending: Orthopedic Surgery | Admitting: Orthopedic Surgery

## 2016-02-14 ENCOUNTER — Ambulatory Visit (HOSPITAL_COMMUNITY): Payer: Medicare Other | Admitting: Vascular Surgery

## 2016-02-14 DIAGNOSIS — Z79899 Other long term (current) drug therapy: Secondary | ICD-10-CM | POA: Diagnosis not present

## 2016-02-14 DIAGNOSIS — K219 Gastro-esophageal reflux disease without esophagitis: Secondary | ICD-10-CM | POA: Insufficient documentation

## 2016-02-14 DIAGNOSIS — M5116 Intervertebral disc disorders with radiculopathy, lumbar region: Secondary | ICD-10-CM | POA: Insufficient documentation

## 2016-02-14 DIAGNOSIS — M5410 Radiculopathy, site unspecified: Secondary | ICD-10-CM

## 2016-02-14 DIAGNOSIS — Z7982 Long term (current) use of aspirin: Secondary | ICD-10-CM | POA: Insufficient documentation

## 2016-02-14 DIAGNOSIS — Z87891 Personal history of nicotine dependence: Secondary | ICD-10-CM | POA: Diagnosis not present

## 2016-02-14 HISTORY — PX: LUMBAR LAMINECTOMY/DECOMPRESSION MICRODISCECTOMY: SHX5026

## 2016-02-14 SURGERY — LUMBAR LAMINECTOMY/DECOMPRESSION MICRODISCECTOMY 1 LEVEL
Anesthesia: General | Site: Back

## 2016-02-14 MED ORDER — BUPIVACAINE LIPOSOME 1.3 % IJ SUSP
20.0000 mL | INTRAMUSCULAR | Status: AC
  Administered 2016-02-14: 20 mL
  Filled 2016-02-14: qty 20

## 2016-02-14 MED ORDER — ONDANSETRON HCL 4 MG/2ML IJ SOLN
INTRAMUSCULAR | Status: AC
  Filled 2016-02-14: qty 4

## 2016-02-14 MED ORDER — THROMBIN 20000 UNITS EX SOLR
CUTANEOUS | Status: DC | PRN
  Administered 2016-02-14: 20 mL via TOPICAL

## 2016-02-14 MED ORDER — LIDOCAINE HCL (CARDIAC) 20 MG/ML IV SOLN
INTRAVENOUS | Status: DC | PRN
  Administered 2016-02-14: 100 mg via INTRAVENOUS

## 2016-02-14 MED ORDER — FENTANYL CITRATE (PF) 100 MCG/2ML IJ SOLN
INTRAMUSCULAR | Status: AC
  Filled 2016-02-14: qty 4

## 2016-02-14 MED ORDER — 0.9 % SODIUM CHLORIDE (POUR BTL) OPTIME
TOPICAL | Status: DC | PRN
  Administered 2016-02-14: 1000 mL

## 2016-02-14 MED ORDER — MIDAZOLAM HCL 5 MG/5ML IJ SOLN
INTRAMUSCULAR | Status: DC | PRN
  Administered 2016-02-14: 1 mg via INTRAVENOUS

## 2016-02-14 MED ORDER — OXYCODONE HCL 5 MG/5ML PO SOLN: 5.0000 mg | Freq: Once | ORAL | Status: DC | PRN

## 2016-02-14 MED ORDER — PHENYLEPHRINE 40 MCG/ML (10ML) SYRINGE FOR IV PUSH (FOR BLOOD PRESSURE SUPPORT)
PREFILLED_SYRINGE | INTRAVENOUS | Status: AC
  Filled 2016-02-14: qty 10

## 2016-02-14 MED ORDER — ACETAMINOPHEN 160 MG/5ML PO SOLN
325.0000 mg | ORAL | Status: DC | PRN
  Filled 2016-02-14: qty 20.3

## 2016-02-14 MED ORDER — THROMBIN 20000 UNITS EX SOLR
CUTANEOUS | Status: AC
  Filled 2016-02-14: qty 20000

## 2016-02-14 MED ORDER — MIDAZOLAM HCL 2 MG/2ML IJ SOLN
INTRAMUSCULAR | Status: AC
  Filled 2016-02-14: qty 2

## 2016-02-14 MED ORDER — LACTATED RINGERS IV SOLN
INTRAVENOUS | Status: DC
  Administered 2016-02-14 (×2): via INTRAVENOUS

## 2016-02-14 MED ORDER — ACETAMINOPHEN 325 MG PO TABS: 325.0000 mg | ORAL_TABLET | ORAL | Status: DC | PRN

## 2016-02-14 MED ORDER — FENTANYL CITRATE (PF) 100 MCG/2ML IJ SOLN
INTRAMUSCULAR | Status: DC | PRN
  Administered 2016-02-14: 50 ug via INTRAVENOUS
  Administered 2016-02-14: 25 ug via INTRAVENOUS
  Administered 2016-02-14 (×2): 50 ug via INTRAVENOUS
  Administered 2016-02-14: 25 ug via INTRAVENOUS
  Administered 2016-02-14: 50 ug via INTRAVENOUS
  Administered 2016-02-14 (×2): 25 ug via INTRAVENOUS

## 2016-02-14 MED ORDER — PROPOFOL 10 MG/ML IV BOLUS
INTRAVENOUS | Status: DC | PRN
  Administered 2016-02-14: 150 mg via INTRAVENOUS

## 2016-02-14 MED ORDER — FENTANYL CITRATE (PF) 100 MCG/2ML IJ SOLN: 25.0000 ug | INTRAMUSCULAR | Status: DC | PRN

## 2016-02-14 MED ORDER — BUPIVACAINE HCL (PF) 0.25 % IJ SOLN
INTRAMUSCULAR | Status: AC
  Filled 2016-02-14: qty 30

## 2016-02-14 MED ORDER — BUPIVACAINE-EPINEPHRINE 0.25% -1:200000 IJ SOLN
INTRAMUSCULAR | Status: DC | PRN
  Administered 2016-02-14: 30 mL

## 2016-02-14 MED ORDER — METHYLPREDNISOLONE ACETATE 40 MG/ML IJ SUSP
INTRAMUSCULAR | Status: AC
  Filled 2016-02-14: qty 1

## 2016-02-14 MED ORDER — DEXAMETHASONE SODIUM PHOSPHATE 10 MG/ML IJ SOLN
INTRAMUSCULAR | Status: DC | PRN
  Administered 2016-02-14: 10 mg via INTRAVENOUS

## 2016-02-14 MED ORDER — ROCURONIUM BROMIDE 100 MG/10ML IV SOLN
INTRAVENOUS | Status: DC | PRN
  Administered 2016-02-14: 40 mg via INTRAVENOUS
  Administered 2016-02-14: 10 mg via INTRAVENOUS

## 2016-02-14 MED ORDER — FENTANYL CITRATE (PF) 100 MCG/2ML IJ SOLN
INTRAMUSCULAR | Status: AC
  Filled 2016-02-14: qty 2

## 2016-02-14 MED ORDER — PHENYLEPHRINE HCL 10 MG/ML IJ SOLN
INTRAVENOUS | Status: DC | PRN
  Administered 2016-02-14: 25 ug/min via INTRAVENOUS

## 2016-02-14 MED ORDER — THROMBIN 5000 UNITS EX SOLR
CUTANEOUS | Status: DC | PRN
  Administered 2016-02-14 (×2): 5000 [IU] via TOPICAL

## 2016-02-14 MED ORDER — PROPOFOL 10 MG/ML IV BOLUS
INTRAVENOUS | Status: AC
  Filled 2016-02-14: qty 20

## 2016-02-14 MED ORDER — LIDOCAINE 2% (20 MG/ML) 5 ML SYRINGE
INTRAMUSCULAR | Status: AC
  Filled 2016-02-14: qty 10

## 2016-02-14 MED ORDER — ONDANSETRON HCL 4 MG/2ML IJ SOLN
INTRAMUSCULAR | Status: DC | PRN
  Administered 2016-02-14: 4 mg via INTRAVENOUS

## 2016-02-14 MED ORDER — MEPERIDINE HCL 25 MG/ML IJ SOLN: 6.2500 mg | INTRAMUSCULAR | Status: DC | PRN

## 2016-02-14 MED ORDER — ROCURONIUM BROMIDE 50 MG/5ML IV SOSY
PREFILLED_SYRINGE | INTRAVENOUS | Status: AC
  Filled 2016-02-14: qty 5

## 2016-02-14 MED ORDER — ONDANSETRON HCL 4 MG/2ML IJ SOLN: 4.0000 mg | Freq: Once | INTRAMUSCULAR | Status: DC | PRN

## 2016-02-14 MED ORDER — SUGAMMADEX SODIUM 200 MG/2ML IV SOLN
INTRAVENOUS | Status: DC | PRN
  Administered 2016-02-14: 150 mg via INTRAVENOUS

## 2016-02-14 MED ORDER — KETOROLAC TROMETHAMINE 30 MG/ML IJ SOLN: 30.0000 mg | Freq: Once | INTRAMUSCULAR | Status: DC

## 2016-02-14 MED ORDER — THROMBIN 20000 UNITS EX KIT
PACK | CUTANEOUS | Status: AC
  Filled 2016-02-14: qty 1

## 2016-02-14 MED ORDER — METHYLPREDNISOLONE ACETATE 40 MG/ML IJ SUSP
INTRAMUSCULAR | Status: DC | PRN
  Administered 2016-02-14: 40 mg

## 2016-02-14 MED ORDER — SUGAMMADEX SODIUM 200 MG/2ML IV SOLN
INTRAVENOUS | Status: AC
  Filled 2016-02-14: qty 2

## 2016-02-14 MED ORDER — DEXAMETHASONE SODIUM PHOSPHATE 10 MG/ML IJ SOLN
INTRAMUSCULAR | Status: AC
  Filled 2016-02-14: qty 1

## 2016-02-14 MED ORDER — OXYCODONE HCL 5 MG PO TABS: 5.0000 mg | ORAL_TABLET | Freq: Once | ORAL | Status: DC | PRN

## 2016-02-14 MED ORDER — PHENYLEPHRINE HCL 10 MG/ML IJ SOLN
INTRAMUSCULAR | Status: DC | PRN
  Administered 2016-02-14 (×4): 40 ug via INTRAVENOUS
  Administered 2016-02-14 (×2): 80 ug via INTRAVENOUS

## 2016-02-14 SURGICAL SUPPLY — 77 items
APL SKNCLS STERI-STRIP NONHPOA (GAUZE/BANDAGES/DRESSINGS) ×1
BENZOIN TINCTURE PRP APPL 2/3 (GAUZE/BANDAGES/DRESSINGS) ×2 IMPLANT
BUR ROUND PRECISION 4.0 (BURR) ×2 IMPLANT
BUR ROUND PRECISION 4.0MM (BURR) ×1
CANISTER SUCTION 2500CC (MISCELLANEOUS) ×3 IMPLANT
CARTRIDGE OIL MAESTRO DRILL (MISCELLANEOUS) ×1 IMPLANT
CLOSURE STERI-STRIP 1/2X4 (GAUZE/BANDAGES/DRESSINGS) ×1
CLOSURE WOUND 1/2 X4 (GAUZE/BANDAGES/DRESSINGS) ×1
CLSR STERI-STRIP ANTIMIC 1/2X4 (GAUZE/BANDAGES/DRESSINGS) ×1 IMPLANT
CORDS BIPOLAR (ELECTRODE) ×3 IMPLANT
COVER SURGICAL LIGHT HANDLE (MISCELLANEOUS) ×3 IMPLANT
DIFFUSER DRILL AIR PNEUMATIC (MISCELLANEOUS) ×3 IMPLANT
DRAIN CHANNEL 15F RND FF W/TCR (WOUND CARE) IMPLANT
DRAPE POUCH INSTRU U-SHP 10X18 (DRAPES) ×6 IMPLANT
DRAPE SURG 17X23 STRL (DRAPES) ×12 IMPLANT
DURAPREP 26ML APPLICATOR (WOUND CARE) ×3 IMPLANT
ELECT BLADE 4.0 EZ CLEAN MEGAD (MISCELLANEOUS)
ELECT CAUTERY BLADE 6.4 (BLADE) ×3 IMPLANT
ELECT REM PT RETURN 9FT ADLT (ELECTROSURGICAL) ×3
ELECTRODE BLDE 4.0 EZ CLN MEGD (MISCELLANEOUS) IMPLANT
ELECTRODE REM PT RTRN 9FT ADLT (ELECTROSURGICAL) ×1 IMPLANT
EVACUATOR SILICONE 100CC (DRAIN) IMPLANT
FILTER STRAW FLUID ASPIR (MISCELLANEOUS) IMPLANT
GAUZE SPONGE 4X4 12PLY STRL (GAUZE/BANDAGES/DRESSINGS) ×3 IMPLANT
GAUZE SPONGE 4X4 16PLY XRAY LF (GAUZE/BANDAGES/DRESSINGS) ×4 IMPLANT
GLOVE BIO SURGEON STRL SZ7 (GLOVE) ×5 IMPLANT
GLOVE BIO SURGEON STRL SZ8 (GLOVE) ×3 IMPLANT
GLOVE BIOGEL PI IND STRL 7.0 (GLOVE) ×1 IMPLANT
GLOVE BIOGEL PI IND STRL 8 (GLOVE) ×1 IMPLANT
GLOVE BIOGEL PI INDICATOR 7.0 (GLOVE) ×4
GLOVE BIOGEL PI INDICATOR 8 (GLOVE) ×2
GOWN STRL REUS W/ TWL LRG LVL3 (GOWN DISPOSABLE) ×1 IMPLANT
GOWN STRL REUS W/ TWL XL LVL3 (GOWN DISPOSABLE) ×2 IMPLANT
GOWN STRL REUS W/TWL LRG LVL3 (GOWN DISPOSABLE) ×6
GOWN STRL REUS W/TWL XL LVL3 (GOWN DISPOSABLE) ×6
IV CATH 14GX2 1/4 (CATHETERS) ×5 IMPLANT
KIT BASIN OR (CUSTOM PROCEDURE TRAY) ×3 IMPLANT
KIT POSITION SURG JACKSON T1 (MISCELLANEOUS) ×1 IMPLANT
KIT ROOM TURNOVER OR (KITS) ×3 IMPLANT
NDL 18GX1X1/2 (RX/OR ONLY) (NEEDLE) ×1 IMPLANT
NDL HYPO 25GX1X1/2 BEV (NEEDLE) ×1 IMPLANT
NDL SPNL 18GX3.5 QUINCKE PK (NEEDLE) ×2 IMPLANT
NEEDLE 18GX1X1/2 (RX/OR ONLY) (NEEDLE) ×9 IMPLANT
NEEDLE 22X1 1/2 (OR ONLY) (NEEDLE) ×3 IMPLANT
NEEDLE HYPO 25GX1X1/2 BEV (NEEDLE) ×3 IMPLANT
NEEDLE SPNL 18GX3.5 QUINCKE PK (NEEDLE) ×12 IMPLANT
NS IRRIG 1000ML POUR BTL (IV SOLUTION) ×3 IMPLANT
OIL CARTRIDGE MAESTRO DRILL (MISCELLANEOUS) ×3
PACK LAMINECTOMY ORTHO (CUSTOM PROCEDURE TRAY) ×3 IMPLANT
PACK UNIVERSAL I (CUSTOM PROCEDURE TRAY) ×3 IMPLANT
PAD ARMBOARD 7.5X6 YLW CONV (MISCELLANEOUS) ×6 IMPLANT
PATTIES SURGICAL .5 X.5 (GAUZE/BANDAGES/DRESSINGS) ×4 IMPLANT
PATTIES SURGICAL .5 X1 (DISPOSABLE) ×3 IMPLANT
SPONGE GAUZE 4X4 12PLY STER LF (GAUZE/BANDAGES/DRESSINGS) ×2 IMPLANT
SPONGE INTESTINAL PEANUT (DISPOSABLE) ×3 IMPLANT
SPONGE SURGIFOAM ABS GEL 100 (HEMOSTASIS) ×3 IMPLANT
SPONGE SURGIFOAM ABS GEL SZ50 (HEMOSTASIS) ×3 IMPLANT
STRIP CLOSURE SKIN 1/2X4 (GAUZE/BANDAGES/DRESSINGS) ×2 IMPLANT
SURGIFLO W/THROMBIN 8M KIT (HEMOSTASIS) ×2 IMPLANT
SUT MNCRL AB 4-0 PS2 18 (SUTURE) ×3 IMPLANT
SUT VIC AB 0 CT1 18XCR BRD 8 (SUTURE) IMPLANT
SUT VIC AB 0 CT1 27 (SUTURE)
SUT VIC AB 0 CT1 27XBRD ANBCTR (SUTURE) IMPLANT
SUT VIC AB 0 CT1 8-18 (SUTURE)
SUT VIC AB 1 CT1 18XCR BRD 8 (SUTURE) ×1 IMPLANT
SUT VIC AB 1 CT1 8-18 (SUTURE) ×3
SUT VIC AB 2-0 CT2 18 VCP726D (SUTURE) ×3 IMPLANT
SYR 20CC LL (SYRINGE) ×2 IMPLANT
SYR BULB IRRIGATION 50ML (SYRINGE) ×3 IMPLANT
SYR CONTROL 10ML LL (SYRINGE) ×6 IMPLANT
SYR TB 1ML 26GX3/8 SAFETY (SYRINGE) ×6 IMPLANT
SYR TB 1ML LUER SLIP (SYRINGE) ×6 IMPLANT
TAPE CLOTH SURG 6X10 WHT LF (GAUZE/BANDAGES/DRESSINGS) ×2 IMPLANT
TOWEL OR 17X24 6PK STRL BLUE (TOWEL DISPOSABLE) ×5 IMPLANT
TOWEL OR 17X26 10 PK STRL BLUE (TOWEL DISPOSABLE) ×3 IMPLANT
WATER STERILE IRR 1000ML POUR (IV SOLUTION) ×3 IMPLANT
YANKAUER SUCT BULB TIP NO VENT (SUCTIONS) ×3 IMPLANT

## 2016-02-14 NOTE — Transfer of Care (Signed)
Immediate Anesthesia Transfer of Care Note  Patient: [name] SpearingCarol R Dodson  Procedure(s) Performed: Procedure(s) with comments: LUMBAR 3-4 DECOMPRESSION (N/A) - LUMBAR 3-4 DECOMPRESSION   Patient Location: PACU  Anesthesia Type:General  Level of Consciousness: awake, alert  and oriented  Airway & Oxygen Therapy: Patient Spontanous Breathing and Patient connected to nasal cannula oxygen  Post-op Assessment: Report given to RN and Post -op Vital signs reviewed and stable  Post vital signs: Reviewed and stable  Last Vitals:  Vitals:   02/14/16 0853 02/14/16 1635  BP: (!) 144/72   Pulse: 67   Resp: 18   Temp: 37 C (P) 36.7 C    Last Pain:  Vitals:   02/14/16 0853  TempSrc: Oral         Complications: No apparent anesthesia complications

## 2016-02-14 NOTE — Anesthesia Preprocedure Evaluation (Signed)
Anesthesia Evaluation  Patient identified by MRN, date of birth, ID band Patient awake    Reviewed: Allergy & Precautions, NPO status , Patient's Chart, lab work & pertinent test results  Airway Mallampati: I       Dental no notable dental hx.    Pulmonary former smoker,    Pulmonary exam normal breath sounds clear to auscultation       Cardiovascular Normal cardiovascular exam+ dysrhythmias Atrial Fibrillation  Rhythm:Regular Rate:Normal  Has been cardioverted at least twice. Now on Flecainide and ASA.   Neuro/Psych    GI/Hepatic Neg liver ROS,   Endo/Other  negative endocrine ROS  Renal/GU negative Renal ROS  negative genitourinary   Musculoskeletal   Abdominal Normal abdominal exam  (+)   Peds  Hematology negative hematology ROS (+)   Anesthesia Other Findings   Reproductive/Obstetrics                             Anesthesia Physical Anesthesia Plan  ASA: II  Anesthesia Plan: General   Post-op Pain Management:    Induction: Intravenous  Airway Management Planned: Oral ETT  Additional Equipment:   Intra-op Plan:   Post-operative Plan: Extubation in OR  Informed Consent:   Dental advisory given  Plan Discussed with: CRNA and Surgeon  Anesthesia Plan Comments:         Anesthesia Quick Evaluation

## 2016-02-14 NOTE — Anesthesia Procedure Notes (Signed)
Procedure Name: Intubation Date/Time: 02/14/2016 12:33 PM Performed by: Candis Shine Pre-anesthesia Checklist: Patient identified, Emergency Drugs available, Suction available and Patient being monitored Patient Re-evaluated:Patient Re-evaluated prior to inductionOxygen Delivery Method: Circle System Utilized Preoxygenation: Pre-oxygenation with 100% oxygen Intubation Type: IV induction Ventilation: Mask ventilation without difficulty Laryngoscope Size: Mac and 3 Grade View: Grade II Tube type: Oral Tube size: 7.0 mm Number of attempts: 1 Airway Equipment and Method: Stylet and Oral airway Placement Confirmation: ETT inserted through vocal cords under direct vision,  positive ETCO2 and breath sounds checked- equal and bilateral Secured at: 22 cm Tube secured with: Tape Dental Injury: Teeth and Oropharynx as per pre-operative assessment

## 2016-02-15 ENCOUNTER — Encounter (HOSPITAL_COMMUNITY): Payer: Self-pay | Admitting: Orthopedic Surgery

## 2016-02-15 NOTE — Op Note (Signed)
NAME:  [name] Dodson, [name]                  ACCOUNT NO.:  [id number]  MEDICAL RECORD NO.:  [id number]  LOCATION:                                FACILITY:  [facility]  PHYSICIAN:  Estill BambergMark Daryl Quiros, MD      DATE OF BIRTH:  [birth year]  DATE OF PROCEDURE:  02/14/2016 DATE OF DISCHARGE:  02/14/2016                              OPERATIVE REPORT   PREOPERATIVE DIAGNOSES: 1. Left-sided L3 radiculopathy. 2. Left-sided L3-4 foraminal/extraforaminal disk herniation, causing     severe compression of the left L3 nerve.  POSTOPERATIVE DIAGNOSES: 1. Left-sided L3 radiculopathy. 2. Left-sided L3-4 foraminal/extraforaminal disk herniation, causing     severe compression of the left L3 nerve.  PROCEDURE:  Left-sided L3-4 transpedicular decompression with removal of complex herniated L3-4 disk herniation.  SURGEON:  Estill BambergMark Dauntae Derusha, MD  ASSISTANT:  Jason CoopKayla McKenzie, [facility]  ANESTHESIA:  General endotracheal anesthesia.  COMPLICATIONS:  None.  DISPOSITION:  Stable.  ESTIMATED BLOOD LOSS:  Minimal.  INDICATIONS FOR SURGERY:  Briefly, [name] is a pleasant 71 year old female who at this point has had a and 2894-month history of ongoing and debilitating pain in the left leg.  The pain has been increasing.  An MRI did reveal an obvious large prominent disk herniation at the L3-4 level on the left side, compressing the left L3 nerve.  The herniation was migrated superiorly to the level of the left L3 pedicle, displacing the nerve posteriorly and slightly inferiorly.  Given the patient's ongoing pain and dysfunction, we did discuss proceeding with the procedure reflected above.  The patient was fully aware of the risks and limitations of surgery and did elect to proceed.  OPERATIVE DETAILS:  On February 14, 2016, patient was brought to surgery and general endotracheal anesthesia was administered.  The patient was placed prone on a well-padded flat Jackson bed with a Wilson frame. Antibiotics were given.  A time-out  procedure was performed.  A right- sided paramedian incision was made, 2 cm off the midline.  Using a self- retaining retractor was placed.  The lateral aspect of the L3 pars interarticularis was noted, as was the transverse process of L3 and L4. The appropriate level was confirmed using an intraoperative lateral x- ray.  Using a high-speed bur, I did remove the lateral aspect of the pars.  I did perform a partial facetectomy at the L3-4 level.  The intertransverse membrane was entered.  The exiting L3 nerve was identified and noted to be under tension.  Immediately, medial and slightly superior to the nerve, there was noted to be a complex extruded disk fragment.  The fragment was located both above and below the nerve. I did have to extensively meticulously manipulate the nerve, retracting it both superiorly and inferiorly, as well as laterally, in order to gain access to the entirety of the extruded disk fragment.  The herniation was readily identified as being a rather complex herniation, as there were multiple fragments located behind the L3 vertebral body, substantially displacing the right L3 nerve.  All herniated fragments were thoroughly and entirely removed, and I was very pleased with the final decompression.  The wound was then copiously irrigated.  All  bleeding was controlled using bipolar electrocautery in addition to Surgiflo.  Depo-Medrol 40 mg was then introduced about the epidural space in the region of the exiting right L3 nerve.  The wound was then closed in layers using #1 Vicryl followed by 2-0 Vicryl followed by 4-0 Monocryl.  Benzoin and Steri-Strips were applied followed by sterile dressing.  All instrument counts were correct at the termination of the procedure.  Of note, Jason Coop was my assistant throughout the surgery, and did aid in retraction, suctioning, and closure from start to finish.     Estill Bamberg, MD     MD/MEDQ  D:  02/14/2016  T:   02/15/2016  Job:  [id number]

## 2016-02-17 NOTE — Anesthesia Postprocedure Evaluation (Addendum)
Anesthesia Post Note  Patient: [name] SpearingCarol R Dodson  Procedure(s) Performed: Procedure(s) (LRB): LUMBAR 3-4 DECOMPRESSION (N/A)  Patient location during evaluation: PACU Anesthesia Type: General Level of consciousness: awake and sedated Pain management: pain level controlled Vital Signs Assessment: post-procedure vital signs reviewed and stable Respiratory status: spontaneous breathing Cardiovascular status: stable Postop Assessment: no signs of nausea or vomiting Anesthetic complications: no        Last Vitals:  Vitals:   02/14/16 1730 02/14/16 1736  BP: 130/83 (!) 127/96  Pulse: 78 79  Resp: 18 (!) 24  Temp:  36.4 C    Last Pain:  Vitals:   02/14/16 1736  TempSrc:   PainSc: 0-No pain   Pain Goal: Patients Stated Pain Goal: 3 (02/14/16 1705)               [name] JR,JOHN [name]

## 2016-05-22 ENCOUNTER — Other Ambulatory Visit: Payer: Self-pay | Admitting: Orthopedic Surgery

## 2016-06-06 ENCOUNTER — Other Ambulatory Visit: Payer: Self-pay

## 2016-06-06 ENCOUNTER — Encounter (HOSPITAL_COMMUNITY): Payer: Self-pay

## 2016-06-06 ENCOUNTER — Encounter (HOSPITAL_COMMUNITY)
Admission: RE | Admit: 2016-06-06 | Discharge: 2016-06-06 | Disposition: A | Discharge status: Home / Self Care | Payer: Medicare Other | Source: Ambulatory Visit | Attending: Orthopedic Surgery | Admitting: Orthopedic Surgery

## 2016-06-06 DIAGNOSIS — K219 Gastro-esophageal reflux disease without esophagitis: Secondary | ICD-10-CM | POA: Diagnosis not present

## 2016-06-06 DIAGNOSIS — F329 Major depressive disorder, single episode, unspecified: Secondary | ICD-10-CM | POA: Diagnosis not present

## 2016-06-06 DIAGNOSIS — Z9049 Acquired absence of other specified parts of digestive tract: Secondary | ICD-10-CM | POA: Insufficient documentation

## 2016-06-06 DIAGNOSIS — I4891 Unspecified atrial fibrillation: Secondary | ICD-10-CM | POA: Insufficient documentation

## 2016-06-06 DIAGNOSIS — Z01812 Encounter for preprocedural laboratory examination: Secondary | ICD-10-CM | POA: Insufficient documentation

## 2016-06-06 DIAGNOSIS — Z79899 Other long term (current) drug therapy: Secondary | ICD-10-CM | POA: Insufficient documentation

## 2016-06-06 DIAGNOSIS — Z96641 Presence of right artificial hip joint: Secondary | ICD-10-CM | POA: Insufficient documentation

## 2016-06-06 DIAGNOSIS — I4892 Unspecified atrial flutter: Secondary | ICD-10-CM | POA: Insufficient documentation

## 2016-06-06 DIAGNOSIS — M1990 Unspecified osteoarthritis, unspecified site: Secondary | ICD-10-CM | POA: Diagnosis not present

## 2016-06-06 DIAGNOSIS — M549 Dorsalgia, unspecified: Secondary | ICD-10-CM | POA: Insufficient documentation

## 2016-06-06 HISTORY — DX: Dyspnea, unspecified: R06.00

## 2016-06-06 HISTORY — DX: Cardiac arrhythmia, unspecified: I49.9

## 2016-06-06 LAB — TYPE AND SCREEN
ABO/RH(D): O POS
Antibody Screen: NEGATIVE

## 2016-06-06 LAB — CBC WITH DIFFERENTIAL/PLATELET
BASOS PCT: 0 %
Basophils Absolute: 0 10*3/uL (ref 0.0–0.1)
Eosinophils Absolute: 0.1 10*3/uL (ref 0.0–0.7)
Eosinophils Relative: 2 %
HEMATOCRIT: 38.3 % (ref 36.0–46.0)
HEMOGLOBIN: 13.1 g/dL (ref 12.0–15.0)
Lymphocytes Relative: 37 %
Lymphs Abs: 2.5 10*3/uL (ref 0.7–4.0)
MCH: 34.3 pg — AB (ref 26.0–34.0)
MCHC: 34.2 g/dL (ref 30.0–36.0)
MCV: 100.3 fL — AB (ref 78.0–100.0)
MONO ABS: 0.5 10*3/uL (ref 0.1–1.0)
MONOS PCT: 7 %
NEUTROS ABS: 3.7 10*3/uL (ref 1.7–7.7)
NEUTROS PCT: 54 %
Platelets: 237 10*3/uL (ref 150–400)
RBC: 3.82 MIL/uL — ABNORMAL LOW (ref 3.87–5.11)
RDW: 13 % (ref 11.5–15.5)
WBC: 6.8 10*3/uL (ref 4.0–10.5)

## 2016-06-06 LAB — URINALYSIS, ROUTINE W REFLEX MICROSCOPIC
Bilirubin Urine: NEGATIVE
GLUCOSE, UA: NEGATIVE mg/dL
Hgb urine dipstick: NEGATIVE
Ketones, ur: NEGATIVE mg/dL
LEUKOCYTES UA: NEGATIVE
Nitrite: NEGATIVE
PH: 5 (ref 5.0–8.0)
PROTEIN: NEGATIVE mg/dL
Specific Gravity, Urine: 1.013 (ref 1.005–1.030)

## 2016-06-06 LAB — COMPREHENSIVE METABOLIC PANEL
ALK PHOS: 68 U/L (ref 38–126)
ALT: 13 U/L — ABNORMAL LOW (ref 14–54)
ANION GAP: 8 (ref 5–15)
AST: 19 U/L (ref 15–41)
Albumin: 3.9 g/dL (ref 3.5–5.0)
BILIRUBIN TOTAL: 0.5 mg/dL (ref 0.3–1.2)
BUN: 13 mg/dL (ref 6–20)
CALCIUM: 9.3 mg/dL (ref 8.9–10.3)
CO2: 26 mmol/L (ref 22–32)
Chloride: 104 mmol/L (ref 101–111)
Creatinine, Ser: 0.73 mg/dL (ref 0.44–1.00)
GLUCOSE: 105 mg/dL — AB (ref 65–99)
POTASSIUM: 4.3 mmol/L (ref 3.5–5.1)
Sodium: 138 mmol/L (ref 135–145)
TOTAL PROTEIN: 7 g/dL (ref 6.5–8.1)

## 2016-06-06 LAB — SURGICAL PCR SCREEN
MRSA, PCR: NEGATIVE
STAPHYLOCOCCUS AUREUS: NEGATIVE

## 2016-06-06 LAB — APTT: aPTT: 34 seconds (ref 24–36)

## 2016-06-06 LAB — ABO/RH: ABO/RH(D): O POS

## 2016-06-06 LAB — PROTIME-INR
INR: 1.04
PROTHROMBIN TIME: 13.7 s (ref 11.4–15.2)

## 2016-06-06 NOTE — Pre-Procedure Instructions (Signed)
Rikki SpearingCarol R Studley  06/06/2016      [facility] [phone] - [place] - [address] [facility] [address] [place] [id number] Phone: [phone] Fax: [phone]  [id number] [place], [place] - [address] [place] [id number] Phone: [phone] Fax: [phone]    Your procedure is scheduled on May 9  [facility] Admitting at 630 A.M.  Call this number if you have problems the morning of surgery:  [phone]   Remember:  Do not eat food or drink liquids after midnight.  Take these medicines the morning of surgery with A SIP OF WATER Flecainide (Tambocor), gabapentin (Neurontin)  Stop taking Asprin, BC's, Goody's, Herbal medications, Ibuprofen, Advil, motrin, Aleve, Vitamins   Do not wear jewelry, make-up or nail polish.  Do not wear lotions, powders, or perfumes, or deoderant.  Do not shave 48 hours prior to surgery.  Men may shave face and neck.  Do not bring valuables to the hospital.  [facility] is not responsible for any belongings or valuables.  Contacts, dentures or bridgework may not be worn into surgery.  Leave your suitcase in the car.  After surgery it may be brought to your room.  For patients admitted to the hospital, discharge time will be determined by your treatment team.  Patients discharged the day of surgery will not be allowed to drive home.    Special instructions:  [place] - Preparing for Surgery  Before surgery, you can play an important role.  Because skin is not sterile, your skin needs to be as free of germs as possible.  You can reduce the number of germs on you skin by washing with CHG (chlorahexidine gluconate) soap before surgery.  CHG is an antiseptic cleaner which kills germs and bonds with the skin to continue killing germs even after washing.  Please DO NOT use if you have an allergy to CHG or antibacterial soaps.  If  your skin becomes reddened/irritated stop using the CHG and inform your nurse when you arrive at Short Stay.  Do not shave (including legs and underarms) for at least 48 hours prior to the first CHG shower.  You may shave your face.  Please follow these instructions carefully:   1.  Shower with CHG Soap the night before surgery and the                                morning of Surgery.  2.  If you choose to wash your hair, wash your hair first as usual with your       normal shampoo.  3.  After you shampoo, rinse your hair and body thoroughly to remove the                      Shampoo.  4.  Use CHG as you would any other liquid soap.  You can apply chg directly       to the skin and wash gently with scrungie or a clean washcloth.  5.  Apply the CHG Soap to your body ONLY FROM THE NECK DOWN.        Do not use on open wounds or open sores.  Avoid contact with your eyes,       ears, mouth and genitals (private parts).  Wash  genitals (private parts)       with your normal soap.  6.  Wash thoroughly, paying special attention to the area where your surgery        will be performed.  7.  Thoroughly rinse your body with warm water from the neck down.  8.  DO NOT shower/wash with your normal soap after using and rinsing off       the CHG Soap.  9.  Pat yourself dry with a clean towel.            10.  Wear clean pajamas.            11.  Place clean sheets on your bed the night of your first shower and do not        sleep with pets.  Day of Surgery  Do not apply any lotions/deoderants the morning of surgery.  Please wear clean clothes to the hospital/surgery center.    Please read over the following fact sheets that you were given. Pain Booklet, Coughing and Deep Breathing, MRSA Information and Surgical Site Infection Prevention

## 2016-06-06 NOTE — Progress Notes (Addendum)
[place] [place] [facility] - states she has only been once to her. Cardiologist is Dr Annye [facility]Boyce from [facility]- saw last year [phone] is his office number. States she had a rash and burning with the CHG soap- added to allergy list  See Allison's note form 02-12-16 pt had surgery in Jan 2018

## 2016-06-09 NOTE — Progress Notes (Signed)
Anesthesia Chart Review: Patient is a 71 year old female scheduled for right sided L3-4 fusion on 06/11/16 by Dr. Yevette Edwardsumonski. She is s/p L3-4 decompression on 02/14/16.  History includes former smoker, post-operative N/V, afib 04/08/08 s/p DCCV 05/24/09 s/p pulmonary vein isolation and SVT/aflutter 10/02/09 with recurrent afib 2016 s/p DCCV 10/27/14 (CHADS2 score is 0, so prescribed ASA only), GERD, depression, arthritis, chronic back pain with N/T RLE, cholecystectomy '06, right partial hip replacement, right back lipoma excision '04, left L3-4 decompression 02/14/16.  - PCP is [place]ford [facility]Williams [facility] (only seen once before). - Cardiologist is Dr. Kandice RobinsonsKer Boyce in [place], last visit 05/28/15. She was back in SR and on flecainide following DCCV 10/27/14. One year follow-up recommended.  Meds include flecainide, Neurotin, Omega-3. ASA is not currently listed on medication list.   BP 131/66   Pulse 64   Temp 36.5 C   Resp 20   Ht 5\' 8"  (1.727 m)   Wt 205 lb 8 oz (93.2 kg)   SpO2 96%   BMI 31.25 kg/m   EKG 06/06/16: NSR.   Echo 09/21/14: Conclusions: The study quality is technically difficult. Atrial fibrillation. Estimated EF 60-65%. Left atrium is mildly dilated. Aortic valve is trilleaflet. Mild aortic cusp sclerosis is present.   She reported last cath and stress test were > 10 years ago; however, according to 05/28/15 office note from Dr. Annye AsaBoyce: - Cardiac cath 04/11/09: EF 50%, normal coronary arteries.   CXR 02/12/16: IMPRESSION: Borderline cardiomegaly. Bibasilar atelectasis/scarring.  Preoperative labs noted.   She remains in SR. Normal coronaries by cath in 2011. She tolerated back surgery in January. If no acute changes then I would anticipate that she can proceed as planned.  Velna Ochsllison Mansour Balboa, PA-C [facility] Short Stay Center/Anesthesiology Phone [phone] 06/09/2016 1:37 PM

## 2016-06-11 ENCOUNTER — Inpatient Hospital Stay (HOSPITAL_COMMUNITY): Payer: Medicare Other | Admitting: Emergency Medicine

## 2016-06-11 ENCOUNTER — Inpatient Hospital Stay (HOSPITAL_COMMUNITY): Payer: Medicare Other | Admitting: Anesthesiology

## 2016-06-11 ENCOUNTER — Encounter (HOSPITAL_COMMUNITY): Payer: Self-pay | Admitting: *Deleted

## 2016-06-11 ENCOUNTER — Inpatient Hospital Stay (HOSPITAL_COMMUNITY): Payer: Medicare Other

## 2016-06-11 ENCOUNTER — Inpatient Hospital Stay (HOSPITAL_COMMUNITY)
Admission: RE | Admit: 2016-06-11 | Discharge: 2016-06-14 | DRG: 455 | Disposition: A | Discharge status: Skilled Nursing Facility | Payer: Medicare Other | Source: Ambulatory Visit | Attending: Orthopedic Surgery | Admitting: Orthopedic Surgery

## 2016-06-11 ENCOUNTER — Inpatient Hospital Stay (HOSPITAL_COMMUNITY)
Admission: RE | Disposition: A | Discharge status: Skilled Nursing Facility | Payer: Self-pay | Source: Ambulatory Visit | Attending: Orthopedic Surgery

## 2016-06-11 DIAGNOSIS — Z87891 Personal history of nicotine dependence: Secondary | ICD-10-CM

## 2016-06-11 DIAGNOSIS — M5116 Intervertebral disc disorders with radiculopathy, lumbar region: Secondary | ICD-10-CM | POA: Diagnosis present

## 2016-06-11 DIAGNOSIS — I4891 Unspecified atrial fibrillation: Secondary | ICD-10-CM | POA: Diagnosis present

## 2016-06-11 DIAGNOSIS — Z419 Encounter for procedure for purposes other than remedying health state, unspecified: Secondary | ICD-10-CM

## 2016-06-11 DIAGNOSIS — Z96641 Presence of right artificial hip joint: Secondary | ICD-10-CM | POA: Diagnosis present

## 2016-06-11 DIAGNOSIS — M5410 Radiculopathy, site unspecified: Secondary | ICD-10-CM | POA: Diagnosis present

## 2016-06-11 DIAGNOSIS — K219 Gastro-esophageal reflux disease without esophagitis: Secondary | ICD-10-CM | POA: Diagnosis present

## 2016-06-11 DIAGNOSIS — Z79899 Other long term (current) drug therapy: Secondary | ICD-10-CM | POA: Diagnosis not present

## 2016-06-11 DIAGNOSIS — M4186 Other forms of scoliosis, lumbar region: Secondary | ICD-10-CM | POA: Diagnosis present

## 2016-06-11 DIAGNOSIS — M79604 Pain in right leg: Secondary | ICD-10-CM | POA: Diagnosis present

## 2016-06-11 SURGERY — POSTERIOR LUMBAR FUSION 1 LEVEL
Anesthesia: General | Site: Back

## 2016-06-11 MED ORDER — HYDROMORPHONE HCL 1 MG/ML IJ SOLN: 0.2500 mg | INTRAMUSCULAR | Status: DC | PRN

## 2016-06-11 MED ORDER — METHYLENE BLUE 0.5 % INJ SOLN
INTRAVENOUS | Status: AC
  Filled 2016-06-11: qty 10

## 2016-06-11 MED ORDER — ONDANSETRON HCL 4 MG/2ML IJ SOLN
INTRAMUSCULAR | Status: DC | PRN
  Administered 2016-06-11: 4 mg via INTRAVENOUS

## 2016-06-11 MED ORDER — THROMBIN 20000 UNITS EX SOLR
OROMUCOSAL | Status: DC | PRN
  Administered 2016-06-11: 10:00:00 via TOPICAL

## 2016-06-11 MED ORDER — PROPOFOL 1000 MG/100ML IV EMUL
INTRAVENOUS | Status: AC
  Filled 2016-06-11: qty 300

## 2016-06-11 MED ORDER — MIDAZOLAM HCL 2 MG/2ML IJ SOLN
INTRAMUSCULAR | Status: AC
  Filled 2016-06-11: qty 2

## 2016-06-11 MED ORDER — MENTHOL 3 MG MT LOZG: 1.0000 | LOZENGE | OROMUCOSAL | Status: DC | PRN

## 2016-06-11 MED ORDER — PROPOFOL 10 MG/ML IV BOLUS
INTRAVENOUS | Status: AC
  Filled 2016-06-11: qty 40

## 2016-06-11 MED ORDER — ARTIFICIAL TEARS OPHTHALMIC OINT
TOPICAL_OINTMENT | OPHTHALMIC | Status: DC | PRN
Start: 2016-06-11 — End: 2016-06-11
  Administered 2016-06-11: 1 via OPHTHALMIC

## 2016-06-11 MED ORDER — DEXAMETHASONE SODIUM PHOSPHATE 10 MG/ML IJ SOLN
INTRAMUSCULAR | Status: AC
  Filled 2016-06-11: qty 1

## 2016-06-11 MED ORDER — FENTANYL CITRATE (PF) 100 MCG/2ML IJ SOLN
INTRAMUSCULAR | Status: DC | PRN
  Administered 2016-06-11 (×5): 50 ug via INTRAVENOUS

## 2016-06-11 MED ORDER — ARTIFICIAL TEARS OPHTHALMIC OINT
TOPICAL_OINTMENT | OPHTHALMIC | Status: AC
  Filled 2016-06-11: qty 3.5

## 2016-06-11 MED ORDER — ONDANSETRON HCL 4 MG/2ML IJ SOLN
INTRAMUSCULAR | Status: AC
  Filled 2016-06-11: qty 2

## 2016-06-11 MED ORDER — BUPIVACAINE LIPOSOME 1.3 % IJ SUSP
20.0000 mL | INTRAMUSCULAR | Status: AC
  Administered 2016-06-11: 20 mL
  Filled 2016-06-11: qty 20

## 2016-06-11 MED ORDER — EPHEDRINE SULFATE 50 MG/ML IJ SOLN
INTRAMUSCULAR | Status: DC | PRN
  Administered 2016-06-11: 10 mg via INTRAVENOUS
  Administered 2016-06-11: 20 mg via INTRAVENOUS
  Administered 2016-06-11 (×2): 10 mg via INTRAVENOUS

## 2016-06-11 MED ORDER — PHENOL 1.4 % MT LIQD: 1.0000 | OROMUCOSAL | Status: DC | PRN

## 2016-06-11 MED ORDER — LIDOCAINE HCL 4 % EX SOLN
CUTANEOUS | Status: DC | PRN
  Administered 2016-06-11: 1 mL via TOPICAL

## 2016-06-11 MED ORDER — DIAZEPAM 5 MG PO TABS
5.0000 mg | ORAL_TABLET | Freq: Four times a day (QID) | ORAL | Status: DC | PRN
  Administered 2016-06-12 – 2016-06-13 (×3): 5 mg via ORAL
  Filled 2016-06-11 (×3): qty 1

## 2016-06-11 MED ORDER — ONDANSETRON HCL 4 MG PO TABS: 4.0000 mg | ORAL_TABLET | Freq: Four times a day (QID) | ORAL | Status: DC | PRN

## 2016-06-11 MED ORDER — POTASSIUM CHLORIDE IN NACL 20-0.9 MEQ/L-% IV SOLN: INTRAVENOUS | Status: DC

## 2016-06-11 MED ORDER — ROCURONIUM BROMIDE 100 MG/10ML IV SOLN
INTRAVENOUS | Status: DC | PRN
  Administered 2016-06-11 (×3): 10 mg via INTRAVENOUS
  Administered 2016-06-11: 50 mg via INTRAVENOUS

## 2016-06-11 MED ORDER — EPINEPHRINE PF 1 MG/ML IJ SOLN
INTRAMUSCULAR | Status: AC
  Filled 2016-06-11: qty 1

## 2016-06-11 MED ORDER — CEFAZOLIN SODIUM-DEXTROSE 2-4 GM/100ML-% IV SOLN
2.0000 g | INTRAVENOUS | Status: AC
  Administered 2016-06-11 (×2): 2 g via INTRAVENOUS

## 2016-06-11 MED ORDER — GABAPENTIN 400 MG PO CAPS
800.0000 mg | ORAL_CAPSULE | Freq: Three times a day (TID) | ORAL | Status: DC
  Administered 2016-06-11 – 2016-06-14 (×10): 800 mg via ORAL
  Filled 2016-06-11 (×10): qty 2

## 2016-06-11 MED ORDER — FENTANYL CITRATE (PF) 250 MCG/5ML IJ SOLN
INTRAMUSCULAR | Status: AC
  Filled 2016-06-11: qty 10

## 2016-06-11 MED ORDER — BUPIVACAINE HCL (PF) 0.25 % IJ SOLN
INTRAMUSCULAR | Status: AC
  Filled 2016-06-11: qty 30

## 2016-06-11 MED ORDER — SENNOSIDES-DOCUSATE SODIUM 8.6-50 MG PO TABS: 1.0000 | ORAL_TABLET | Freq: Every evening | ORAL | Status: DC | PRN

## 2016-06-11 MED ORDER — BISACODYL 5 MG PO TBEC: 5.0000 mg | DELAYED_RELEASE_TABLET | Freq: Every day | ORAL | Status: DC | PRN

## 2016-06-11 MED ORDER — LIDOCAINE HCL (CARDIAC) 20 MG/ML IV SOLN
INTRAVENOUS | Status: DC | PRN
  Administered 2016-06-11: 100 mg via INTRAVENOUS

## 2016-06-11 MED ORDER — SODIUM CHLORIDE 0.9% FLUSH: 3.0000 mL | INTRAVENOUS | Status: DC | PRN

## 2016-06-11 MED ORDER — PHENYLEPHRINE HCL 10 MG/ML IJ SOLN
INTRAMUSCULAR | Status: DC | PRN
Start: 2016-06-11 — End: 2016-06-11
  Administered 2016-06-11 (×4): 40 ug via INTRAVENOUS

## 2016-06-11 MED ORDER — LIDOCAINE 2% (20 MG/ML) 5 ML SYRINGE
INTRAMUSCULAR | Status: AC
  Filled 2016-06-11: qty 5

## 2016-06-11 MED ORDER — SODIUM CHLORIDE 0.9 % IV SOLN: 250.0000 mL | INTRAVENOUS | Status: DC

## 2016-06-11 MED ORDER — DOCUSATE SODIUM 100 MG PO CAPS
100.0000 mg | ORAL_CAPSULE | Freq: Two times a day (BID) | ORAL | Status: DC
Start: 2016-06-11 — End: 2016-06-14
  Administered 2016-06-11 – 2016-06-14 (×6): 100 mg via ORAL
  Filled 2016-06-11 (×6): qty 1

## 2016-06-11 MED ORDER — PHENYLEPHRINE 40 MCG/ML (10ML) SYRINGE FOR IV PUSH (FOR BLOOD PRESSURE SUPPORT)
PREFILLED_SYRINGE | INTRAVENOUS | Status: AC
  Filled 2016-06-11: qty 10

## 2016-06-11 MED ORDER — PROPOFOL 500 MG/50ML IV EMUL
INTRAVENOUS | Status: DC | PRN
  Administered 2016-06-11: 50 ug/kg/min via INTRAVENOUS

## 2016-06-11 MED ORDER — GABAPENTIN 800 MG PO TABS
800.0000 mg | ORAL_TABLET | Freq: Three times a day (TID) | ORAL | Status: DC
  Filled 2016-06-11: qty 1

## 2016-06-11 MED ORDER — SODIUM CHLORIDE 0.9% FLUSH
3.0000 mL | Freq: Two times a day (BID) | INTRAVENOUS | Status: DC
  Administered 2016-06-11 – 2016-06-14 (×6): 3 mL via INTRAVENOUS

## 2016-06-11 MED ORDER — SUGAMMADEX SODIUM 200 MG/2ML IV SOLN
INTRAVENOUS | Status: DC | PRN
  Administered 2016-06-11: 200 mg via INTRAVENOUS

## 2016-06-11 MED ORDER — 0.9 % SODIUM CHLORIDE (POUR BTL) OPTIME
TOPICAL | Status: DC | PRN
  Administered 2016-06-11: 1000 mL

## 2016-06-11 MED ORDER — MEPERIDINE HCL 25 MG/ML IJ SOLN: 6.2500 mg | INTRAMUSCULAR | Status: DC | PRN

## 2016-06-11 MED ORDER — EPHEDRINE 5 MG/ML INJ
INTRAVENOUS | Status: AC
  Filled 2016-06-11: qty 10

## 2016-06-11 MED ORDER — PHENYLEPHRINE HCL 10 MG/ML IJ SOLN
INTRAMUSCULAR | Status: DC | PRN
  Administered 2016-06-11: 25 ug/min via INTRAVENOUS

## 2016-06-11 MED ORDER — BUPIVACAINE-EPINEPHRINE 0.25% -1:200000 IJ SOLN
INTRAMUSCULAR | Status: DC | PRN
  Administered 2016-06-11: 30 mL

## 2016-06-11 MED ORDER — ONDANSETRON HCL 4 MG/2ML IJ SOLN: 4.0000 mg | Freq: Once | INTRAMUSCULAR | Status: DC | PRN

## 2016-06-11 MED ORDER — CEFAZOLIN SODIUM-DEXTROSE 2-4 GM/100ML-% IV SOLN
2.0000 g | Freq: Three times a day (TID) | INTRAVENOUS | Status: AC
  Administered 2016-06-11 – 2016-06-12 (×2): 2 g via INTRAVENOUS
  Filled 2016-06-11 (×2): qty 100

## 2016-06-11 MED ORDER — OXYCODONE-ACETAMINOPHEN 5-325 MG PO TABS
1.0000 | ORAL_TABLET | ORAL | Status: DC | PRN
  Administered 2016-06-11 – 2016-06-12 (×3): 1 via ORAL
  Administered 2016-06-13 – 2016-06-14 (×6): 2 via ORAL
  Filled 2016-06-11 (×2): qty 1
  Filled 2016-06-11 (×3): qty 2
  Filled 2016-06-11: qty 1
  Filled 2016-06-11 (×3): qty 2

## 2016-06-11 MED ORDER — ACETAMINOPHEN 325 MG PO TABS: 650.0000 mg | ORAL_TABLET | ORAL | Status: DC | PRN

## 2016-06-11 MED ORDER — PROPOFOL 10 MG/ML IV BOLUS
INTRAVENOUS | Status: DC | PRN
  Administered 2016-06-11: 120 mg via INTRAVENOUS

## 2016-06-11 MED ORDER — ACETAMINOPHEN 650 MG RE SUPP: 650.0000 mg | RECTAL | Status: DC | PRN

## 2016-06-11 MED ORDER — MIDAZOLAM HCL 5 MG/5ML IJ SOLN
INTRAMUSCULAR | Status: DC | PRN
  Administered 2016-06-11 (×2): 1 mg via INTRAVENOUS

## 2016-06-11 MED ORDER — FLECAINIDE ACETATE 100 MG PO TABS
100.0000 mg | ORAL_TABLET | Freq: Two times a day (BID) | ORAL | Status: DC
  Administered 2016-06-11 – 2016-06-14 (×6): 100 mg via ORAL
  Filled 2016-06-11 (×7): qty 1

## 2016-06-11 MED ORDER — THROMBIN 20000 UNITS EX SOLR
CUTANEOUS | Status: AC
  Filled 2016-06-11: qty 20000

## 2016-06-11 MED ORDER — ALUM & MAG HYDROXIDE-SIMETH 200-200-20 MG/5ML PO SUSP: 30.0000 mL | Freq: Four times a day (QID) | ORAL | Status: DC | PRN

## 2016-06-11 MED ORDER — SODIUM CHLORIDE 0.9 % IV SOLN
INTRAVENOUS | Status: DC | PRN
  Administered 2016-06-11: 12:00:00 via INTRAVENOUS

## 2016-06-11 MED ORDER — ONDANSETRON HCL 4 MG/2ML IJ SOLN: 4.0000 mg | Freq: Four times a day (QID) | INTRAMUSCULAR | Status: DC | PRN

## 2016-06-11 MED ORDER — ZOLPIDEM TARTRATE 5 MG PO TABS: 5.0000 mg | ORAL_TABLET | Freq: Every evening | ORAL | Status: DC | PRN

## 2016-06-11 MED ORDER — LACTATED RINGERS IV SOLN
INTRAVENOUS | Status: DC | PRN
  Administered 2016-06-11 (×3): via INTRAVENOUS

## 2016-06-11 MED ORDER — FLEET ENEMA 7-19 GM/118ML RE ENEM: 1.0000 | ENEMA | Freq: Once | RECTAL | Status: DC | PRN

## 2016-06-11 MED ORDER — POVIDONE-IODINE 7.5 % EX SOLN: Freq: Once | CUTANEOUS | Status: DC

## 2016-06-11 MED ORDER — PANTOPRAZOLE SODIUM 40 MG IV SOLR
40.0000 mg | Freq: Every day | INTRAVENOUS | Status: DC
  Administered 2016-06-11 – 2016-06-12 (×2): 40 mg via INTRAVENOUS
  Filled 2016-06-11 (×2): qty 40

## 2016-06-11 MED ORDER — SUGAMMADEX SODIUM 200 MG/2ML IV SOLN
INTRAVENOUS | Status: AC
  Filled 2016-06-11: qty 2

## 2016-06-11 MED ORDER — MORPHINE SULFATE (PF) 4 MG/ML IV SOLN: 1.0000 mg | INTRAVENOUS | Status: DC | PRN

## 2016-06-11 MED ORDER — PROPOFOL 500 MG/50ML IV EMUL
INTRAVENOUS | Status: AC
  Filled 2016-06-11: qty 150

## 2016-06-11 MED ORDER — ROCURONIUM BROMIDE 10 MG/ML (PF) SYRINGE
PREFILLED_SYRINGE | INTRAVENOUS | Status: AC
  Filled 2016-06-11: qty 5

## 2016-06-11 MED FILL — Sodium Chloride IV Soln 0.9%: INTRAVENOUS | Qty: 1000 | Status: AC

## 2016-06-11 MED FILL — Sodium Chloride Irrigation Soln 0.9%: Qty: 3000 | Status: AC

## 2016-06-11 MED FILL — Heparin Sodium (Porcine) Inj 1000 Unit/ML: INTRAMUSCULAR | Qty: 30 | Status: AC

## 2016-06-11 SURGICAL SUPPLY — 92 items
APL SKNCLS STERI-STRIP NONHPOA (GAUZE/BANDAGES/DRESSINGS) ×1
BENZOIN TINCTURE PRP APPL 2/3 (GAUZE/BANDAGES/DRESSINGS) ×3 IMPLANT
BIT DRILL 3.2 (BIT) ×2
BIT DRILL 65X3.2XQC STP NS (BIT) IMPLANT
BIT DRL 65X3.2XQC STP NS (BIT) ×1
BLADE CLIPPER SURG (BLADE) IMPLANT
BONE VIVIGEN FORMABLE 10CC (Bone Implant) ×3 IMPLANT
BUR PRESCISION 1.7 ELITE (BURR) ×3 IMPLANT
BUR ROUND PRECISION 4.0 (BURR) IMPLANT
BUR ROUND PRECISION 4.0MM (BURR)
BUR SABER RD CUTTING 3.0 (BURR) IMPLANT
BUR SABER RD CUTTING 3.0MM (BURR)
CARTRIDGE OIL MAESTRO DRILL (MISCELLANEOUS) ×1 IMPLANT
CLOSURE STERI-STRIP 1/2X4 (GAUZE/BANDAGES/DRESSINGS) ×1
CLOSURE WOUND 1/2 X4 (GAUZE/BANDAGES/DRESSINGS) ×2
CLSR STERI-STRIP ANTIMIC 1/2X4 (GAUZE/BANDAGES/DRESSINGS) ×1 IMPLANT
CONT SPEC 4OZ CLIKSEAL STRL BL (MISCELLANEOUS) ×3 IMPLANT
COVER MAYO STAND STRL (DRAPES) ×6 IMPLANT
COVER SURGICAL LIGHT HANDLE (MISCELLANEOUS) ×3 IMPLANT
DIFFUSER DRILL AIR PNEUMATIC (MISCELLANEOUS) ×3 IMPLANT
DRAIN CHANNEL 15F RND FF W/TCR (WOUND CARE) IMPLANT
DRAPE C-ARM 42X72 X-RAY (DRAPES) ×3 IMPLANT
DRAPE C-ARMOR (DRAPES) ×2 IMPLANT
DRAPE POUCH INSTRU U-SHP 10X18 (DRAPES) ×3 IMPLANT
DRAPE SURG 17X23 STRL (DRAPES) ×12 IMPLANT
DURAPREP 26ML APPLICATOR (WOUND CARE) ×3 IMPLANT
ELECT BLADE 4.0 EZ CLEAN MEGAD (MISCELLANEOUS) ×3
ELECT CAUTERY BLADE 6.4 (BLADE) ×3 IMPLANT
ELECT REM PT RETURN 9FT ADLT (ELECTROSURGICAL) ×3
ELECTRODE BLDE 4.0 EZ CLN MEGD (MISCELLANEOUS) ×1 IMPLANT
ELECTRODE REM PT RTRN 9FT ADLT (ELECTROSURGICAL) ×1 IMPLANT
EVACUATOR SILICONE 100CC (DRAIN) IMPLANT
FEE INTRAOP MONITOR IMPULS NCS (MISCELLANEOUS) IMPLANT
GAUZE SPONGE 4X4 12PLY STRL (GAUZE/BANDAGES/DRESSINGS) ×3 IMPLANT
GAUZE SPONGE 4X4 16PLY XRAY LF (GAUZE/BANDAGES/DRESSINGS) ×3 IMPLANT
GLOVE BIO SURGEON STRL SZ7 (GLOVE) ×3 IMPLANT
GLOVE BIO SURGEON STRL SZ8 (GLOVE) ×3 IMPLANT
GLOVE BIOGEL PI IND STRL 7.0 (GLOVE) ×1 IMPLANT
GLOVE BIOGEL PI IND STRL 8 (GLOVE) ×1 IMPLANT
GLOVE BIOGEL PI INDICATOR 7.0 (GLOVE) ×2
GLOVE BIOGEL PI INDICATOR 8 (GLOVE) ×2
GOWN STRL REUS W/ TWL LRG LVL3 (GOWN DISPOSABLE) ×2 IMPLANT
GOWN STRL REUS W/ TWL XL LVL3 (GOWN DISPOSABLE) ×1 IMPLANT
GOWN STRL REUS W/TWL LRG LVL3 (GOWN DISPOSABLE) ×6
GOWN STRL REUS W/TWL XL LVL3 (GOWN DISPOSABLE) ×3
INTRAOP MONITOR FEE IMPULS NCS (MISCELLANEOUS) ×1
INTRAOP MONITOR FEE IMPULSE (MISCELLANEOUS) ×2
IV CATH 14GX2 1/4 (CATHETERS) ×3 IMPLANT
KIT BASIN OR (CUSTOM PROCEDURE TRAY) ×3 IMPLANT
KIT POSITION SURG JACKSON T1 (MISCELLANEOUS) ×3 IMPLANT
KIT ROOM TURNOVER OR (KITS) ×3 IMPLANT
MARKER SKIN DUAL TIP RULER LAB (MISCELLANEOUS) ×3 IMPLANT
NDL HYPO 25GX1X1/2 BEV (NEEDLE) ×1 IMPLANT
NDL SAFETY ECLIPSE 18X1.5 (NEEDLE) ×1 IMPLANT
NDL SPNL 18GX3.5 QUINCKE PK (NEEDLE) ×2 IMPLANT
NEEDLE 22X1 1/2 (OR ONLY) (NEEDLE) ×3 IMPLANT
NEEDLE HYPO 18GX1.5 SHARP (NEEDLE) ×3
NEEDLE HYPO 25GX1X1/2 BEV (NEEDLE) ×3 IMPLANT
NEEDLE SPNL 18GX3.5 QUINCKE PK (NEEDLE) ×6 IMPLANT
NS IRRIG 1000ML POUR BTL (IV SOLUTION) ×3 IMPLANT
OIL CARTRIDGE MAESTRO DRILL (MISCELLANEOUS) ×3
PACK LAMINECTOMY ORTHO (CUSTOM PROCEDURE TRAY) ×3 IMPLANT
PACK UNIVERSAL I (CUSTOM PROCEDURE TRAY) ×3 IMPLANT
PAD ARMBOARD 7.5X6 YLW CONV (MISCELLANEOUS) ×6 IMPLANT
PATTIES SURGICAL .5 X1 (DISPOSABLE) ×3 IMPLANT
PATTIES SURGICAL .5X1.5 (GAUZE/BANDAGES/DRESSINGS) ×3 IMPLANT
PROBE PEDCLE PROBE MAGSTM DISP (MISCELLANEOUS) ×2 IMPLANT
ROD PRE BENT EXP 40MM (Rod) ×4 IMPLANT
SCREW SET SINGLE INNER (Screw) ×8 IMPLANT
SCREW VIPER CORT FIX 6.00X30 (Screw) ×2 IMPLANT
SCREW VIPER CORT FIX 6X35 (Screw) ×6 IMPLANT
SPACER CONCORDE 11X14X27MM (Spacer) ×2 IMPLANT
SPONGE INTESTINAL PEANUT (DISPOSABLE) ×3 IMPLANT
SPONGE SURGIFOAM ABS GEL 100 (HEMOSTASIS) ×3 IMPLANT
STRIP CLOSURE SKIN 1/2X4 (GAUZE/BANDAGES/DRESSINGS) ×4 IMPLANT
SURGIFLO W/THROMBIN 8M KIT (HEMOSTASIS) IMPLANT
SUT MNCRL AB 4-0 PS2 18 (SUTURE) ×3 IMPLANT
SUT VIC AB 0 CT1 18XCR BRD 8 (SUTURE) ×1 IMPLANT
SUT VIC AB 0 CT1 8-18 (SUTURE) ×3
SUT VIC AB 1 CT1 18XCR BRD 8 (SUTURE) ×1 IMPLANT
SUT VIC AB 1 CT1 8-18 (SUTURE) ×3
SUT VIC AB 2-0 CT2 18 VCP726D (SUTURE) ×3 IMPLANT
SYR 20CC LL (SYRINGE) ×3 IMPLANT
SYR BULB IRRIGATION 50ML (SYRINGE) ×3 IMPLANT
SYR CONTROL 10ML LL (SYRINGE) ×6 IMPLANT
SYR TB 1ML LUER SLIP (SYRINGE) ×3 IMPLANT
TAPE CLOTH SURG 4X10 WHT LF (GAUZE/BANDAGES/DRESSINGS) ×2 IMPLANT
TOWEL OR 17X24 6PK STRL BLUE (TOWEL DISPOSABLE) ×3 IMPLANT
TOWEL OR 17X26 10 PK STRL BLUE (TOWEL DISPOSABLE) ×3 IMPLANT
TRAY FOLEY W/METER SILVER 16FR (SET/KITS/TRAYS/PACK) ×3 IMPLANT
WATER STERILE IRR 1000ML POUR (IV SOLUTION) ×3 IMPLANT
YANKAUER SUCT BULB TIP NO VENT (SUCTIONS) ×3 IMPLANT

## 2016-06-11 NOTE — Op Note (Signed)
NAME:  [name] Dodson, [name] Dodson                       ACCOUNT NO.:  MEDICAL RECORD NO.:  [id number]  LOCATION:                                 FACILITY:  PHYSICIAN:  Estill BambergMark Desi Carby, MD           DATE OF BIRTH:  DATE OF PROCEDURE:  06/11/2016                              OPERATIVE REPORT   PREOPERATIVE DIAGNOSES: 1. 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 2. Severe left L3 radiculopathy. 3. Status post previous L3-4 decompression. 4. Degenerative lumbar scoliosis.  POSTOPERATIVE DIAGNOSES: 1. 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 2. Severe left L3 radiculopathy. 3. Status post previous L3-4 decompression. 4. Degenerative lumbar scoliosis.  PROCEDURE: 1. Left-sided revision decompression, L3-4, with removal of extruded     adherent L3-4 disk fragment, severely compressing the left L3     nerve. 2. Left-sided L3-4 transforaminal lumbar interbody fusion. 3. Right-sided L3-4 posterolateral fusion. 4. Insertion of interbody device x1 (14 x 27 mm lordotic Concorde     bullet cage). 5. Placement of posterior instrumentation using a cortical medial to     lateral trajectory technique. 6. Use of local autograft. 7. Use of morselized allograft - Vivi[employer]. 8. Intraoperative use of fluoroscopy.  SURGEON:  Estill BambergMark Isaish Alemu, MD.  ASSISTANJason Coop:  Kayla McKenzie, PA-C.  ANESTHESIA:  General endotracheal anesthesia.  COMPLICATIONS:  None.  DISPOSITION:  Stable.  ESTIMATED BLOOD LOSS:  300 mL.  INDICATIONS FOR SURGERY:  Briefly, Ms. [name] DareKing is a very pleasant 71 year old female, who did initially presented to me with severe pain in her left leg with an extraforaminal L3-4 disk herniation.  The patient did undergo surgery and initially did well, however, had a recurrence of pain.  A repeat MRI did reveal a recurrent left-sided L3-4 herniated disk.  Given the patient's recurrence of pain and ongoing pain, we did discuss proceeding with the procedure reflected above.  The patient  was fully aware of the risks and limitations of surgery and did elect to proceed.  OPERATIVE DETAILS:  On Jun 11, 2016, the patient was brought to surgery and general endotracheal anesthesia was administered.  The patient was placed prone on a well-padded flat Jackson bed with a spinal frame. Antibiotics were given and the back was prepped and draped.  A midline incision was then made.  The fascia was incised at the midline and the paraspinal musculature was bluntly retracted.  The lamina of L3 and L4 was identified and subperiosteally exposed.  The L3-4 facet joints bilaterally were identified and decorticated.  Using AP and lateral fluoroscopy, in addition to anatomic landmarks, I did cannulate the L3 and L4 pedicles bilaterally using a medial to lateral trajectory technique.  On the patient's left side, I did place 6 mm screws of the appropriate length.  A 40 mm rod was placed and distraction was applied across the rod.  On the right side, bone wax was placed in the cannulated pedicle holes.  I then removed the spinous process of L3 and performed a thorough and complete right-sided L3-4 facetectomy.  The exiting L3 nerve was identified and noted to be very erythematous and inflamed and swollen.  I  was able to gently retract the nerve superiorly, and immediately, ventral to the nerve was noted to be an extruded herniated disk fragment, clearly compressing the nerve.  I was able to displace multiple fragments into the intervertebral space to decompress the nerve, after which point they were removed.  I then proceeded with the fusion.  The L3-4 intervertebral disk was thoroughly removed and the endplates were appropriately prepared at the L3 and L4 endplates.  The intervertebral space was then liberally packed with allograft and autograft as was the appropriate size intervertebral implant, after which point the implant was tamped into position in the usual fashion.  I was very pleased  with the press-fit of the implant.  I then placed allograft and autograft into the posterolateral gutter on the left side to help aid in the fusion.  On the right side, 6 mm screws of the appropriate length were placed, followed by 40 mm rod. Distraction was then released on the left side.  All 4 caps were then tightened and a final locking procedure was performed.  The wound was copiously irrigated with approximately 2 L of normal saline prior to placing bone graft.  There was no abnormal EMG activity noted throughout the entire surgery.  I was very pleased with the final AP and lateral fluoroscopic images.  Once the caps were final tightened, there was minor bony bleeding noted, and therefore, a #15 deep Blake drain was placed.  I did use triggered EMG to test the screws on the right, and there was no screw that tested below 20 milliamps.  The wound was then closed in layers using #1 Vicryl, followed by 0 Vicryl, followed by 4-0 Monocryl.  Benzoin and Steri-Strips were applied, followed by sterile dressing.  All instrument counts were correct at the termination of the procedure.  Of note, Jason Coop was my assistant throughout surgery, and did aid in retraction, suctioning, and closure from start to finish.     Estill Bamberg, MD     MD/MEDQ  D:  06/11/2016  T:  06/11/2016  Job:  [id number]

## 2016-06-11 NOTE — Anesthesia Procedure Notes (Addendum)
Procedure Name: Intubation Date/Time: 06/11/2016 8:42 AM Performed by: Fransisca KaufmannMEYER, Gilberta Peeters E Pre-anesthesia Checklist: Patient identified, Emergency Drugs available, Suction available and Patient being monitored Patient Re-evaluated:Patient Re-evaluated prior to inductionOxygen Delivery Method: Circle System Utilized Preoxygenation: Pre-oxygenation with 100% oxygen Intubation Type: IV induction Ventilation: Mask ventilation without difficulty Laryngoscope Size: Miller and 2 Grade View: Grade II Tube type: Oral Tube size: 7.5 mm Number of attempts: 1 Airway Equipment and Method: Stylet and Oral airway Placement Confirmation: ETT inserted through vocal cords under direct vision,  positive ETCO2 and breath sounds checked- equal and bilateral Secured at: 22 cm Tube secured with: Tape Dental Injury: Teeth and Oropharynx as per pre-operative assessment  Comments: Easy intubation

## 2016-06-11 NOTE — H&P (Signed)
PREOPERATIVE H&P  Chief Complaint: Right leg pain  HPI: [name] Dodson is a 71 y.o. female who presents with ongoing pain in the right leg. Patient is s/p a previous L3/4 decompression,a dn did go on to have a recurrent R L3/4 HNP.  MRI reveals a recurrent R L3/4 HNP, compressing the R L3 nerve  Patient has failed multiple forms of conservative care and continues to have pain (see office notes for additional details regarding the patient's full course of treatment)  Past Medical History:  Diagnosis Date  . Arthritis   . Atrial fibrillation (HCC)    afib 04/08/08 s/p DCCV 05/05/09, s/p CTI ablation 10/02/09; recurrent afib 2016 s/p DCCV 10/27/14. On flecainide, ASA. (Dr. Kandice RobinsonsKer Boyce)  . Chronic back pain    stenosis  . Dry skin   . Dyspnea   . Dysrhythmia    a fib  . GERD (gastroesophageal reflux disease)    Tums daily  . History of blood transfusion    no abnormal reaction  . History of colon polyps    benign  . Joint pain   . Nocturia   . Pneumonia    hx of  . PONV (postoperative nausea and vomiting)   . Weakness    numbness and tingling in right leg/foot   Past Surgical History:  Procedure Laterality Date  . cataract surgery Bilateral   . CHOLECYSTECTOMY    . COLONOSCOPY    . ESOPHAGOGASTRODUODENOSCOPY    . heart ablation    . HIP SURGERY Right    with plates/screws-partial replacement  . LUMBAR LAMINECTOMY/DECOMPRESSION MICRODISCECTOMY N/A 02/14/2016   Procedure: LUMBAR 3-4 DECOMPRESSION;  Surgeon: Estill BambergMark Kateleen Encarnacion, MD;  Location: [facility] OR;  Service: Orthopedics;  Laterality: N/A;  LUMBAR 3-4 DECOMPRESSION    Social History   Social History  . Marital status: Widowed    Spouse name: N/A  . Number of children: N/A  . Years of education: N/A   Social History Main Topics  . Smoking status: Former Games developermoker  . Smokeless tobacco: Never Used     Comment: quit smoking 14+ yrs ago  . Alcohol use Yes     Comment: rarely  . Drug use: No  . Sexual activity: Not Asked    Other Topics Concern  . None   Social History Narrative  . None   History reviewed. No pertinent family history. Allergies  Allergen Reactions  . Chlorhexidine Rash    Red rash and burning   Prior to Admission medications   Medication Sig Start Date End Date Taking? Authorizing Provider  flecainide (TAMBOCOR) 100 MG tablet Take 100 mg by mouth 2 (two) times daily.   Yes [provider]  gabapentin (NEURONTIN) 800 MG tablet Take 800 mg by mouth 3 (three) times daily.   Yes [provider]  Omega-3 Fatty Acids (OMEGA-3 PO) Take 2-3 capsules by mouth daily as needed (hip pain).   Yes [provider]     All other systems have been reviewed and were otherwise negative with the exception of those mentioned in the HPI and as above.  Physical Exam: Vitals:   06/11/16 0714  BP: (!) 150/65  Pulse: 66  Resp: 20  Temp: 98.2 F (36.8 C)    General: Alert, no acute distress Cardiovascular: No pedal edema Respiratory: No cyanosis, no use of accessory musculature Skin: No lesions in the area of chief complaint Neurologic: Sensation intact distally Psychiatric: Patient is competent for consent with normal mood and affect  Lymphatic: No axillary or cervical lymphadenopathy  MUSCULOSKELETAL: + SLR on right  Assessment/Plan: Right leg pain  Plan for Procedure(s): RIGHT SIDED LUMBAR 3-4 TRANSFORAMINAL LUMBAR INTERBODY FUSION WITH INSTRUMENTATION AND ALLOGRAFT   Emilee Hero, MD 06/11/2016 8:15 AM

## 2016-06-11 NOTE — Anesthesia Postprocedure Evaluation (Signed)
Anesthesia Post Note  Patient: [name] SpearingCarol R Dodson  Procedure(s) Performed: Procedure(s) (LRB): RIGHT SIDED LUMBAR 3-4 TRANSFORAMINAL LUMBAR INTERBODY FUSION WITH INSTRUMENTATION AND ALLOGRAFT (N/A)  Patient location during evaluation: PACU Anesthesia Type: General Level of consciousness: awake and alert Pain management: pain level controlled Vital Signs Assessment: post-procedure vital signs reviewed and stable Respiratory status: spontaneous breathing, nonlabored ventilation, respiratory function stable and patient connected to nasal cannula oxygen Cardiovascular status: blood pressure returned to baseline and stable Postop Assessment: no signs of nausea or vomiting Anesthetic complications: no       Last Vitals:  Vitals:   06/11/16 1457 06/11/16 1536  BP:  105/64  Pulse:  79  Resp:  14  Temp: 36.7 C 36.5 C    Last Pain:  Vitals:   06/11/16 1536  TempSrc: Oral  PainSc:                  Prentis Langdon DAVID

## 2016-06-11 NOTE — Transfer of Care (Signed)
Immediate Anesthesia Transfer of Care Note  Patient: [name] Dodson  Procedure(s) Performed: Procedure(s) with comments: RIGHT SIDED LUMBAR 3-4 TRANSFORAMINAL LUMBAR INTERBODY FUSION WITH INSTRUMENTATION AND ALLOGRAFT (N/A) - RIGHT SIDED LUMBAR 3-4 TRANSFORAMINAL LUMBAR INTERBODY FUSION WITH INSTRUMENTATION AND ALLOGRAFT; REQUEST 3.5 HOUS  Patient Location: PACU  Anesthesia Type:General  Level of Consciousness: awake, alert , oriented and sedated  Airway & Oxygen Therapy: Patient Spontanous Breathing and Patient connected to nasal cannula oxygen  Post-op Assessment: Report given to RN, Post -op Vital signs reviewed and stable and Patient moving all extremities  Post vital signs: Reviewed and stable  Last Vitals:  Vitals:   06/11/16 0714 06/11/16 1406  BP: (!) 150/65   Pulse: 66   Resp: 20   Temp: 36.8 C (P) 36.6 C    Last Pain:  Vitals:   06/11/16 1406  TempSrc:   PainSc: (P) Asleep      Patients Stated Pain Goal: 2 (06/11/16 [id number])  Complications: No apparent anesthesia complications

## 2016-06-11 NOTE — Anesthesia Preprocedure Evaluation (Signed)
Anesthesia Evaluation  Patient identified by MRN, date of birth, ID band Patient awake    Reviewed: Allergy & Precautions, NPO status , Patient's Chart, lab work & pertinent test results  History of Anesthesia Complications (+) PONV  Airway Mallampati: I  TM Distance: >3 FB Neck ROM: Full    Dental   Pulmonary former smoker,    Pulmonary exam normal        Cardiovascular Normal cardiovascular exam+ dysrhythmias Atrial Fibrillation      Neuro/Psych    GI/Hepatic GERD  Medicated and Controlled,  Endo/Other    Renal/GU      Musculoskeletal   Abdominal   Peds  Hematology   Anesthesia Other Findings   Reproductive/Obstetrics                             Anesthesia Physical Anesthesia Plan  ASA: III  Anesthesia Plan: General   Post-op Pain Management:    Induction: Intravenous  Airway Management Planned: Oral ETT  Additional Equipment:   Intra-op Plan:   Post-operative Plan: Extubation in OR  Informed Consent: I have reviewed the patients History and Physical, chart, labs and discussed the procedure including the risks, benefits and alternatives for the proposed anesthesia with the patient or authorized representative who has indicated his/her understanding and acceptance.     Plan Discussed with: CRNA and Surgeon  Anesthesia Plan Comments:         Anesthesia Quick Evaluation

## 2016-06-12 LAB — CBC
HEMATOCRIT: 29.2 % — AB (ref 36.0–46.0)
HEMOGLOBIN: 10 g/dL — AB (ref 12.0–15.0)
MCH: 34 pg (ref 26.0–34.0)
MCHC: 34.2 g/dL (ref 30.0–36.0)
MCV: 99.3 fL (ref 78.0–100.0)
Platelets: 192 10*3/uL (ref 150–400)
RBC: 2.94 MIL/uL — AB (ref 3.87–5.11)
RDW: 13.1 % (ref 11.5–15.5)
WBC: 15.4 10*3/uL — ABNORMAL HIGH (ref 4.0–10.5)

## 2016-06-12 NOTE — Evaluation (Signed)
Occupational Therapy Evaluation and Discharge/Defer further OT to SNF Patient Details Name: [name] Dodson MRN: [id number] DOB: 04/27/1945 Today's Date: 06/12/2016    History of Present Illness [name] is a 71 y/o female LUMBAR 3-4 TRANSFORAMINAL LUMBAR INTERBODY FUSION. [name] has a past medical history of Arthritis; Atrial fibrillation; Chronic back pain; Dry skin; Dysrhythmia; Joint pain; cataract surgery (Bilateral); Hip surgery (Right); heart ablation; and Lumbar laminectomy/decompression microdiscectomy (02/14/2016).   Clinical Impression   PTA [name] independent in ADL/IADL and mobility with SPC or Rollator. [name] currently mod A for ADL and min guard for ambulation with RW. Back handout provided and reviewed adls in detail. [name] educated on: clothing between brace, never sleep in brace, set an alarm at night for medication, avoid sitting for long periods of time, correct bed positioning for sleeping, correct sequence for bed mobility, avoiding lifting more than 5 pounds and never wash directly over incision. All education is complete and patient/Dodson indicate understanding. [name] will require SNF level therapy to maximize safety and independence in ADL as she lives alone to return to PLOF.       Follow Up Recommendations  SNF    Equipment Recommendations  Other (comment) (defer to next venue of care)    Recommendations for Other Services       Precautions / Restrictions Precautions Precautions: Back;Fall Precaution Booklet Issued: Yes (comment) Precaution Comments: Reviewed back precautions with [name] Required Braces or Orthoses: Spinal Brace Spinal Brace: Thoracolumbosacral orthotic;Applied in sitting position Restrictions Weight Bearing Restrictions: No      Mobility Bed Mobility Overal bed mobility: Needs Assistance Bed Mobility: Rolling;Sidelying to Sit Rolling: Min guard Sidelying to sit: Min guard       General bed mobility comments: verbal cues for sequencing, use of bed rail to  assist with rolling (no physical assist needed from therapist), [name] able to elevate trunk on own she exclaimed "That felt so much better than the way I've been doing it!"  Transfers Overall transfer level: Needs assistance Equipment used: Rolling walker (2 wheeled) Transfers: Sit to/from Stand Sit to Stand: Min guard         General transfer comment: [name] required vc for safe hand placement for sit <> stand, vc for precautions (to prevent twisting) when talking to Dodson during transfer.    Balance Overall balance assessment: Needs assistance Sitting-balance support: No upper extremity supported;Feet supported Sitting balance-Leahy Scale: Good Sitting balance - Comments: [name] able to maintain sitting EOB for conversation with MD and also for donning brace   Standing balance support: Bilateral upper extremity supported Standing balance-Leahy Scale: Poor Standing balance comment: reliant on RW                           ADL either performed or assessed with clinical judgement   ADL Overall ADL's : Needs assistance/impaired Eating/Feeding: Modified independent;Sitting Eating/Feeding Details (indicate cue type and reason): in recliner Grooming: Min guard;Standing Grooming Details (indicate cue type and reason): syncopal episode last night with nursing, min guard for safety - no light headedness or dizziness this session Upper Body Bathing: Minimal assistance;Cueing for sequencing;Cueing for compensatory techniques;Sitting   Lower Body Bathing: Moderate assistance;Sitting/lateral leans   Upper Body Dressing : Minimal assistance   Lower Body Dressing: Moderate assistance   Toilet Transfer: Min guard;RW (BSC over toilet)   Toileting- Clothing Manipulation and Hygiene: Min guard       Functional mobility during ADLs: Min guard;Rolling walker General ADL Comments: Educated [name]  on BLT back precautions (with handout) and reviewed practical application to ADL.     Vision  Patient Visual Report: No change from baseline Vision Assessment?: No apparent visual deficits     Perception     Praxis      Pertinent Vitals/Pain Pain Assessment: Faces Pain Score: 4  Faces Pain Scale: Hurts a little bit Pain Location: Back, with walking Pain Descriptors / Indicators: Discomfort;Operative site guarding Pain Intervention(s): Monitored during session;Repositioned     Hand Dominance Right   Extremity/Trunk Assessment Upper Extremity Assessment Upper Extremity Assessment: Overall WFL for tasks assessed   Lower Extremity Assessment Lower Extremity Assessment: Defer to [name] evaluation   Cervical / Trunk Assessment Cervical / Trunk Assessment:  (slight forward head)   Communication Communication Communication: No difficulties   Cognition Arousal/Alertness: Awake/alert Behavior During Therapy: WFL for tasks assessed/performed Overall Cognitive Status: Within Functional Limits for tasks assessed                                     General Comments  [name] Dodson present this session    Exercises     Shoulder Instructions      Home Living Family/patient expects to be discharged to:: Skilled nursing facility [facility]) Living Arrangements: Alone                               Additional Comments: Lives in an [place] condo at [facility]      Prior Functioning/Environment Level of Independence: Independent with assistive device(s)        Comments: Uses rollator occasionally        OT Problem List: Decreased strength;Decreased range of motion;Decreased activity tolerance;Impaired balance (sitting and/or standing);Decreased safety awareness;Decreased knowledge of use of DME or AE;Decreased knowledge of precautions;Pain      OT Treatment/Interventions:      OT Goals(Current goals can be found in the care plan section) Acute Rehab OT Goals Patient Stated Goal: to go to rehab OT Goal Formulation: With  patient/family Time For Goal Achievement: 06/26/16 Potential to Achieve Goals: Good  OT Frequency:     Barriers to D/C:            Co-evaluation              AM-PAC [name] "6 Clicks" Daily Activity     Outcome Measure Help from another person eating meals?: None Help from another person taking care of personal grooming?: A Little Help from another person toileting, which includes using toliet, bedpan, or urinal?: A Little Help from another person bathing (including washing, rinsing, drying)?: A Lot Help from another person to put on and taking off regular upper body clothing?: A Little Help from another person to put on and taking off regular lower body clothing?: A Lot 6 Click Score: 17   End of Session Equipment Utilized During Treatment: Rolling walker;Back brace Nurse Communication: Mobility status  Activity Tolerance: Patient tolerated treatment well Patient left: in chair;with call bell/phone within reach;with family/visitor present;with nursing/sitter in room  OT Visit Diagnosis: Unsteadiness on feet (R26.81);Muscle weakness (generalized) (M62.81);Pain Pain - Right/Left: Right Pain - part of body: Leg (back)                Time: 1610-9604 OT Time Calculation (min): 30 min Charges:  OT General Charges $OT Visit: 1 Procedure OT Evaluation $OT Eval Moderate  Complexity: 1 Procedure OT Treatments $Self Care/Home Management : 8-22 mins G-Codes:     Sherryl MangesLaura Johnavon Dodson OTR/L [phone]  [name] Dodson 06/12/2016, 10:23 AM

## 2016-06-12 NOTE — Progress Notes (Signed)
    Patient doing well Minimal back pain Patient reports resolution of right leg pain   Physical Exam: Vitals:   06/12/16 0030 06/12/16 0359  BP: 113/66 (!) 97/57  Pulse: 74 65  Resp: 18 20  Temp: 98 F (36.7 C) 97.5 F (36.4 C)   Patient looks excellent Dressing in place NVI  Drain output 23 cc/10 hours  POD #1 s/p revision decompression and fusion at L3/4  - up with PT/OT, encourage ambulation - Percocet for pain, Valium for muscle spasms - likely d/c to SNF when bed available - maintain drain until tomorrow morning

## 2016-06-12 NOTE — Evaluation (Signed)
Physical Therapy Evaluation Patient Details Name: [name] Dodson MRN: [id number] DOB: 1945/05/23 Today's Date: 06/12/2016   History of Present Illness  Pt is a 71 y/o female lumbar 3-4 TLIF. Pt has a past medical history of Arthritis; Atrial fibrillation; Chronic back pain; Dry skin; Dysrhythmia; Joint pain; cataract surgery (Bilateral); Hip surgery (Right); heart ablation; and Lumbar laminectomy/decompression microdiscectomy (02/14/2016).    Clinical Impression  Pt presents to PT S/P above procedure with decreased tolerance for functional mobility due to back precautions, pain and generalized weakness.  She required min guard assist for transfers and ambulation with the RW.  PTA, she was independent with mobility with occasional use of a rollator.  She lives alone and will benefit from continued therapies at the SNF level in order to improve her endurance, increase strength, improve safety awareness and back precaution adherence, and maximize return to PLOF.  Will continue to follow acutely.    Follow Up Recommendations SNF;Supervision/Assistance - 24 hour    Equipment Recommendations  None recommended by PT    Recommendations for Other Services       Precautions / Restrictions Precautions Precautions: Back;Fall Precaution Booklet Issued: Yes (comment) Precaution Comments: Reviewed back precautions with pt Required Braces or Orthoses: Spinal Brace Spinal Brace: Thoracolumbosacral orthotic;Applied in sitting position Restrictions Weight Bearing Restrictions: No      Mobility  Bed Mobility               General bed mobility comments: Not assessed, pt OOB in recliner at start of session and states she practiced log roll with OT.  Transfers Overall transfer level: Needs assistance Equipment used: None;Rolling walker (2 wheeled) Transfers: Sit to/from Stand Sit to Stand: Min guard         General transfer comment: Pt initially stood w/o RW and instructed to have RW in front  with sit<>stand for safety.  Cueing required for precaution maintenance with stand>sit, as pt started to twist with transfer.  Instructed to back up to chair and reach back without twisting to sit.  Ambulation/Gait Ambulation/Gait assistance: Min guard Ambulation Distance (Feet): 200 Feet Assistive device: Rolling walker (2 wheeled) Gait Pattern/deviations: Step-through pattern;Decreased stride length;Trunk flexed;Wide base of support Gait velocity: decreased Gait velocity interpretation: Below normal speed for age/gender General Gait Details: Min guard for safety and because pt states she almost passed out the last time she walked.  Pt declined feeling lightheaded or dizzy. Cues required for upright posture and walker proximity. Pt states she fatigues quickly and educated on energy conservation with RW.  Stairs            Wheelchair Mobility    Modified Rankin (Stroke Patients Only)       Balance Overall balance assessment: Needs assistance Sitting-balance support: No upper extremity supported;Feet supported Sitting balance-Leahy Scale: Fair Sitting balance - Comments: Pt leaned backwards into chair with brace application and states she lost her balance.   Standing balance support: Bilateral upper extremity supported Standing balance-Leahy Scale: Poor Standing balance comment: reliant on RW                             Pertinent Vitals/Pain Pain Assessment: Faces Pain Score: 4  Faces Pain Scale: Hurts a little bit Pain Location: Back, with walking Pain Descriptors / Indicators: Discomfort;Operative site guarding Pain Intervention(s): Limited activity within patient's tolerance;Monitored during session;Repositioned    Home Living Family/patient expects to be discharged to:: Skilled nursing facility [facility]) Living Arrangements:  Alone               Additional Comments: [place] [facility]    Prior Function Level of  Independence: Independent with assistive device(s)         Comments: Uses rollator occasionally     Hand Dominance   Dominant Hand: Right    Extremity/Trunk Assessment   Upper Extremity Assessment Upper Extremity Assessment: Defer to OT evaluation    Lower Extremity Assessment Lower Extremity Assessment: Generalized weakness    Cervical / Trunk Assessment Cervical / Trunk Assessment:  (slight forward head)  Communication   Communication: No difficulties  Cognition Arousal/Alertness: Awake/alert Behavior During Therapy: WFL for tasks assessed/performed Overall Cognitive Status: Within Functional Limits for tasks assessed                                        General Comments General comments (skin integrity, edema, etc.): Pt daughter present this session    Exercises     Assessment/Plan    PT Assessment Patient needs continued PT services  PT Problem List Decreased strength;Decreased range of motion;Decreased activity tolerance;Decreased balance;Decreased mobility;Decreased knowledge of use of DME;Decreased safety awareness;Decreased knowledge of precautions;Pain       PT Treatment Interventions      PT Goals (Current goals can be found in the Care Plan section)  Acute Rehab PT Goals Patient Stated Goal: to go to rehab PT Goal Formulation: With patient Time For Goal Achievement: 06/26/16 Potential to Achieve Goals: Good    Frequency Min 5X/week   Barriers to discharge Decreased caregiver support      Co-evaluation               AM-PAC PT "6 Clicks" Daily Activity  Outcome Measure Difficulty turning over in bed (including adjusting bedclothes, sheets and blankets)?: A Lot Difficulty moving from lying on back to sitting on the side of the bed? : A Lot Difficulty sitting down on and standing up from a chair with arms (e.g., wheelchair, bedside commode, etc,.)?: Total Help needed moving to and from a bed to chair (including a  wheelchair)?: A Little Help needed walking in hospital room?: A Little Help needed climbing 3-5 steps with a railing? : A Little 6 Click Score: 14    End of Session Equipment Utilized During Treatment: Gait belt;Back brace Activity Tolerance: Patient tolerated treatment well Patient left: in chair;with call bell/phone within reach;with family/visitor present Nurse Communication: Mobility status PT Visit Diagnosis: Unsteadiness on feet (R26.81);Other abnormalities of gait and mobility (R26.89);Pain Pain - part of body:  (back)    Time: [id number] PT Time Calculation (min) (ACUTE ONLY): 26 min   Charges:         PT G Codes:        Willaim Rayasachel Jamaury Gumz SPT  Willaim RayasRachel Maudie Shingledecker 06/12/2016, 9:57 AM

## 2016-06-12 NOTE — Progress Notes (Signed)
Brief OT Note; Complete OT Evaluation to Follow  Patient Details Name: [name] SpearingCarol R Belmontes MRN: [id number] DOB: 11/10/1945   Pt pre-[facility] on [facility] for additional therapy prior to return home as Pt lives alone. OT agrees with this discharge placement to maximize safety and independence in ADL and functional transfers and to return to PLOF. OT to defer DME to next venue of care. More in-depth evaluation to follow.  Evern BioLaura J Milly Goggins 06/12/2016, 8:31 AM  Sherryl MangesLaura David Rodriquez OTR/L [phone]

## 2016-06-12 NOTE — NC FL2 (Signed)
[place] MEDICAID FL2 LEVEL OF CARE SCREENING TOOL     IDENTIFICATION  Patient Name: [name] Dodson Birthdate: 1945-06-13 Sex: female Admission Date (Current Location): 06/11/2016  [facility] and [place] Number:  Producer, television/film/video and Address:  [facility], [address] [place], [place] [address]      Provider Number: [id number]  Attending Physician Name and Address:  Estill Bamberg, MD  Relative Name and Phone Number:       Current Level of Care: Hospital Recommended Level of Care: Skilled Nursing Facility Prior Approval Number:    Date Approved/Denied:   PASRR Number:   [id number]   Discharge Plan: SNF    Current Diagnoses: Patient Active Problem List   Diagnosis Date Noted  . Radiculopathy 06/11/2016    Orientation RESPIRATION BLADDER Height & Weight     Self, Time, Situation, Place  Normal Continent Weight: 93.9 kg (207 lb) Height:  5\' 8"  (172.7 cm)  BEHAVIORAL SYMPTOMS/MOOD NEUROLOGICAL BOWEL NUTRITION STATUS      Continent Diet (Please see DC Summary)  AMBULATORY STATUS COMMUNICATION OF NEEDS Skin   Limited Assist Verbally Surgical wounds (Closed incision on back)                       Personal Care Assistance Level of Assistance  Bathing, Feeding, Dressing Bathing Assistance: Limited assistance Feeding assistance: Independent Dressing Assistance: Independent     Functional Limitations Info             SPECIAL CARE FACTORS FREQUENCY  PT (By licensed PT)     PT Frequency: 5x/week              Contractures      Additional Factors Info  Code Status, Allergies Code Status Info: Full Allergies Info: Chlorhexidine           Current Medications (06/12/2016):  This is the current hospital active medication list Current Facility-Administered Medications  Medication Dose Route Frequency Provider Last Rate Last Dose  . 0.9 %  sodium chloride infusion  250 mL Intravenous Continuous McKenzie, Kayla J, PA-C       . 0.9 % NaCl with KCl 20 mEq/ L  infusion   Intravenous Continuous McKenzie, Kayla J, PA-C      . acetaminophen (TYLENOL) tablet 650 mg  650 mg Oral Q4H PRN McKenzie, Kayla J, PA-C       Or  . acetaminophen (TYLENOL) suppository 650 mg  650 mg Rectal Q4H PRN McKenzie, Kayla J, PA-C      . alum & mag hydroxide-simeth (MAALOX/MYLANTA) 200-200-20 MG/5ML suspension 30 mL  30 mL Oral Q6H PRN McKenzie, Kayla J, PA-C      . bisacodyl (DULCOLAX) EC tablet 5 mg  5 mg Oral Daily PRN McKenzie, Kayla J, PA-C      . diazepam (VALIUM) tablet 5 mg  5 mg Oral Q6H PRN McKenzie, Kayla J, PA-C   5 mg at 06/12/16 1409  . docusate sodium (COLACE) capsule 100 mg  100 mg Oral BID McKenzieEilene Ghazi, PA-C   100 mg at 06/12/16 0931  . flecainide (TAMBOCOR) tablet 100 mg  100 mg Oral BID McKenzieEilene Ghazi, PA-C   100 mg at 06/12/16 0931  . gabapentin (NEURONTIN) capsule 800 mg  800 mg Oral TID Renaee Munda, RPH   800 mg at 06/12/16 0930  . menthol-cetylpyridinium (CEPACOL) lozenge 3 mg  1 lozenge Oral PRN McKenzie, Eilene Ghazi, PA-C  Or  . phenol (CHLORASEPTIC) mouth spray 1 spray  1 spray Mouth/Throat PRN McKenzie, Kayla J, PA-C      . morphine 4 MG/ML injection 1-2 mg  1-2 mg Intravenous Q4H PRN McKenzie, Kayla J, PA-C      . ondansetron (ZOFRAN) tablet 4 mg  4 mg Oral Q6H PRN McKenzie, Kayla J, PA-C       Or  . ondansetron (ZOFRAN) injection 4 mg  4 mg Intravenous Q6H PRN McKenzie, Kayla J, PA-C      . oxyCODONE-acetaminophen (PERCOCET/ROXICET) 5-325 MG per tablet 1-2 tablet  1-2 tablet Oral Q4H PRN McKenzie, Eilene GhaziKayla J, PA-C   1 tablet at 06/12/16 1311  . pantoprazole (PROTONIX) injection 40 mg  40 mg Intravenous QHS McKenzie, Eilene GhaziKayla J, PA-C   40 mg at 06/11/16 2013  . senna-docusate (Senokot-S) tablet 1 tablet  1 tablet Oral QHS PRN McKenzie, Kayla J, PA-C      . sodium chloride flush (NS) 0.9 % injection 3 mL  3 mL Intravenous Q12H McKenzie, Kayla J, PA-C   3 mL at 06/12/16 0932  . sodium chloride flush (NS) 0.9 %  injection 3 mL  3 mL Intravenous PRN McKenzie, Kayla J, PA-C      . sodium phosphate (FLEET) 7-19 GM/118ML enema 1 enema  1 enema Rectal Once PRN McKenzie, Kayla J, PA-C      . zolpidem (AMBIEN) tablet 5 mg  5 mg Oral QHS PRN Georga BoraMcKenzie, Kayla J, PA-C         Discharge Medications: Please see discharge summary for a list of discharge medications.  Relevant Imaging Results:  Relevant Lab Results:   Additional Information SSN: [id number]  Ayaan Shutes S [place], [place]

## 2016-06-12 NOTE — Progress Notes (Signed)
Pt received with no noted distress. Surgical gauze dressing site dry and intact. JP in place emptied 20ml. Pt oriented to room. Safety measures in place. Family at bedside. Call bell within reach. Will continue to monitor.

## 2016-06-13 MED ORDER — PANTOPRAZOLE SODIUM 40 MG PO TBEC
40.0000 mg | DELAYED_RELEASE_TABLET | Freq: Every day | ORAL | Status: DC
  Administered 2016-06-13: 40 mg via ORAL
  Filled 2016-06-13: qty 1

## 2016-06-13 NOTE — Progress Notes (Signed)
    Patient doing well, pre-op leg pain is substantially improved, she has bee up and walking, expected PO LBP improving slowly with PT. She is tolerating her brace well and following restrictions. She has been seen by CSW and plan is to D[facility]. Insurance requires 3 midnight stay prior to D/C.   Physical Exam: BP (!) 101/46 (BP Location: Right Arm)   Pulse 63   Temp 97.6 F (36.4 C) (Oral)   Resp 16   Ht 5\' 8"  (1.727 m)   Wt 93.9 kg (207 lb)   SpO2 93%   BMI 31.47 kg/m   Dressing in place, CDI, Drain in place scant D/C, pt sitting upright with TLSO brace in place worn appropriately eating breakfast comfortably. SCD's in place  NVI  POD #2 s/p revison L3-4 Decompression and fusion doing well.   - up with PT/OT, encourage ambulation  - TLSO brace at all times when OOB  - Percocet for pain, Valium for muscle spasms - likely d/c SNF when bed available, OK to D/C from ortho standpoint - D/C drain  - Pt can remove outer bandage and shower over steri strips after 5 days PO

## 2016-06-13 NOTE — Care Management Note (Signed)
Case Management Note  Patient Details  Name: [name] SpearingCarol R Dodson MRN: [id number] Date of Birth: 09/03/1945  Subjective/Objective:  Pt underwent; RIGHT SIDED LUMBAR 3-4 TRANSFORAMINAL LUMBAR INTERBODY FUSION WITH INSTRUMENTATION AND ALLOGRAFT. She is from home alone.                   Action/Plan: PT/OT recommending SNF. Patient requesting [facility]. CM following for further d/c needs.   Expected Discharge Date:                  Expected Discharge Plan:  Skilled Nursing Facility  In-House Referral:  Clinical Social Work  Discharge planning Services     Post Acute Care Choice:    Choice offered to:     DME Arranged:    DME Agency:     HH Arranged:    HH Agency:     Status of Service:  In process, will continue to follow  If discussed at Long Length of Stay Meetings, dates discussed:    Additional Comments:  Kermit BaloKelli F Ciarrah Rae, RN 06/13/2016, 1:13 PM

## 2016-06-13 NOTE — Progress Notes (Signed)
Physical Therapy Treatment Patient Details Name: [name] Dodson MRN: [id number] DOB: May 29, 1945 Today's Date: 06/13/2016    History of Present Illness Pt is a 71 y/o female LUMBAR 3-4 TRANSFORAMINAL LUMBAR INTERBODY FUSION. Pt has a past medical history of Arthritis; Atrial fibrillation; Chronic back pain; Dry skin; Dysrhythmia; Joint pain; cataract surgery (Bilateral); Hip surgery (Right); heart ablation; and Lumbar laminectomy/decompression microdiscectomy (02/14/2016).    PT Comments    PT transfer and ambulation goals met and updated this session and pt progressing toward other PT goals.  Pt unable to recall back precautions and required cueing to maintain.  She was reeducated on back precautions and required supervision to ambulate 300 ft with RW.  Current recommendations remain appropriate as she will benefit from continued therapy acutely and at the SNF level to improve precaution adherence and maximize return to PLOF before returning home where she lives alone.  Will follow acutely.   Follow Up Recommendations  SNF;Supervision/Assistance - 24 hour     Equipment Recommendations  None recommended by PT    Recommendations for Other Services       Precautions / Restrictions Precautions Precautions: Back;Fall Precaution Comments: Reviewed back precautions with pt Required Braces or Orthoses: Spinal Brace Spinal Brace: Thoracolumbosacral orthotic;Applied in sitting position Restrictions Weight Bearing Restrictions: No    Mobility  Bed Mobility Overal bed mobility: Needs Assistance Bed Mobility: Rolling;Sidelying to Sit Rolling: Min guard Sidelying to sit: Min assist       General bed mobility comments: VC's required for log roll sequence and precaution maintenance.  Min A required to elevate trunk to upright.  Transfers Overall transfer level: Needs assistance Equipment used: Rolling walker (2 wheeled) Transfers: Sit to/from Stand Sit to Stand: Supervision          General transfer comment: Pt required vc for safe hand placement for sit <> stand, vc for precautions.  Ambulation/Gait Ambulation/Gait assistance: Supervision Ambulation Distance (Feet): 300 Feet Assistive device: Rolling walker (2 wheeled) Gait Pattern/deviations: Step-through pattern;Decreased stride length;Trunk flexed;Wide base of support Gait velocity: decreased Gait velocity interpretation: Below normal speed for age/gender General Gait Details: Supervision for safety.  VC's required for walker proximity and upright posture.  Pt states she has never been able to stand up straight without some flexion at hips.  Pt educated on tightening belly muscles for trunk support and upright posture.   Stairs            Wheelchair Mobility    Modified Rankin (Stroke Patients Only)       Balance Overall balance assessment: Needs assistance Sitting-balance support: No upper extremity supported;Feet supported Sitting balance-Leahy Scale: Good Sitting balance - Comments: Pt able to sit EOB for brace application.   Standing balance support: Bilateral upper extremity supported Standing balance-Leahy Scale: Poor Standing balance comment: reliant on RW                            Cognition Arousal/Alertness: Awake/alert Behavior During Therapy: WFL for tasks assessed/performed Overall Cognitive Status: Within Functional Limits for tasks assessed                                        Exercises      General Comments        Pertinent Vitals/Pain Pain Assessment: Faces Faces Pain Scale: Hurts a little bit Pain Location: Back, with walking Pain  Descriptors / Indicators: Discomfort;Operative site guarding Pain Intervention(s): Limited activity within patient's tolerance;Monitored during session;Repositioned    Home Living                      Prior Function            PT Goals (current goals can now be found in the care plan section)  Acute Rehab PT Goals Patient Stated Goal: to go to rehab PT Goal Formulation: With patient/family Time For Goal Achievement: 06/26/16 Potential to Achieve Goals: Good Progress towards PT goals: Progressing toward goals    Frequency    Min 5X/week      PT Plan Current plan remains appropriate    Co-evaluation              AM-PAC PT "6 Clicks" Daily Activity  Outcome Measure  Difficulty turning over in bed (including adjusting bedclothes, sheets and blankets)?: A Little Difficulty moving from lying on back to sitting on the side of the bed? : A Lot Difficulty sitting down on and standing up from a chair with arms (e.g., wheelchair, bedside commode, etc,.)?: Total Help needed moving to and from a bed to chair (including a wheelchair)?: A Little Help needed walking in hospital room?: A Little Help needed climbing 3-5 steps with a railing? : A Little 6 Click Score: 15    End of Session Equipment Utilized During Treatment: Gait belt;Back brace Activity Tolerance: Patient tolerated treatment well Patient left: in bed;with call bell/phone within reach;with family/visitor present Nurse Communication: Mobility status PT Visit Diagnosis: Unsteadiness on feet (R26.81);Other abnormalities of gait and mobility (R26.89);Pain Pain - part of body:  (back)     Time: 6962-9528 PT Time Calculation (min) (ACUTE ONLY): 26 min  Charges:                       G Codes:        Gaetano Net SPT   Gaetano Net 06/13/2016, 12:06 PM

## 2016-06-13 NOTE — Clinical Social Work Note (Signed)
Clinical Social Work Assessment  Patient Details  Name: Rikki SpearingCarol R Alpert MRN: [id number] Date of Birth: 03/25/1945  Date of referral:  06/13/16               Reason for consult:  Facility Placement                Permission sought to share information with:  Facility Medical sales representativeContact Representative, Family Supports Permission granted to share information::  Yes, Verbal Permission Granted  Name::     Cydney OkLiz  Agency::  SNFs  Relationship::  Daughter  Contact Information:  [phone]  Housing/Transportation Living arrangements for the past 2 months:  Single Family Home Source of Information:  Patient, Adult Children Patient Interpreter Needed:  None Criminal Activity/Legal Involvement Pertinent to Current Situation/Hospitalization:  No - Comment as needed Significant Relationships:  Adult Children Lives with:  Self Do you feel safe going back to the place where you live?  No Need for family participation in patient care:  Yes (Comment)  Care giving concerns:  CSW received consult for possible SNF placement at time of discharge. CSW spoke with patient and patient's daughter regarding PT recommendation of SNF placement at time of discharge. Patient reported that she lives alone and is currently unable to care for herselfat their home given patient's current physical needs. Patient expressed understanding of PT recommendation and is agreeable to SNF placement at time of discharge. CSW to continue to follow and assist with discharge planning needs.   Social Worker assessment / plan:  CSW spoke with patient [facility].  Employment status:  Retired Health and safety inspectornsurance information:  Medicare PT Recommendations:  Skilled Nursing Facility Information / Referral to community resources:  Skilled Nursing Facility  Patient/Family's Response to care:  Patient recognizes need for rehab before returning home and [place]. Patient reported  preference for [facility]. CSW explained that patient would have to meet Medicare 3 inpatient night stay in order to have insurance pay for SNF. Patient expressed understanding.   Patient/Family's Understanding of and Emotional Response to Diagnosis, Current Treatment, and Prognosis:  Patient/family is realistic regarding therapy needs and expressed being hopeful for SNF placement. Patient expressed understanding of CSW role and discharge process as well as her medical condition. She reported a lot of pain after surgery. No questions/concerns about plan or treatment.    Emotional Assessment Appearance:  Appears stated age Attitude/Demeanor/Rapport:  Other (Appropriate) Affect (typically observed):  Accepting, Appropriate Orientation:  Oriented to Self, Oriented to Situation, Oriented to Place, Oriented to  Time Alcohol / Substance use:  Not Applicable Psych involvement (Current and /or in the community):  No (Comment)  Discharge Needs  Concerns to be addressed:  Care Coordination Readmission within the last 30 days:  No Current discharge risk:  None Barriers to Discharge:  Continued Medical Work up   [employer]adia S Aziya Arena, LCSWA 06/13/2016, 9:09 AM

## 2016-06-13 NOTE — Clinical Social Work Placement (Signed)
   CLINICAL SOCIAL WORK PLACEMENT  NOTE  Date:  06/13/2016  Patient Details  Name: [name] Dodson MRN: [id number] Date of Birth: 09/15/1945  Clinical Social Work is seeking post-discharge placement for this patient at the Skilled  Nursing Facility level of care (*CSW will initial, date and re-position this form in  chart as items are completed):  Yes   Patient/family provided with [facility] Clinical Social Work Department's list of facilities offering this level of care within the geographic area requested by the patient (or if unable, by the patient's family).  Yes   Patient/family informed of their freedom to choose among providers that offer the needed level of care, that participate in Medicare, Medicaid or managed care program needed by the patient, have an available bed and are willing to accept the patient.  Yes   Patient/family informed of [facility]'s ownership interest in [facility] and [facility], as well as of the fact that they are under no obligation to receive care at these facilities.  PASRR submitted to EDS on       PASRR number received on       Existing PASRR number confirmed on 06/12/16     FL2 transmitted to all facilities in geographic area requested by pt/family on 06/12/16     FL2 transmitted to all facilities within larger geographic area on       Patient informed that his/her managed care company has contracts with or will negotiate with certain facilities, including the following:        Yes   Patient/family informed of bed offers received.  [facility]     Physician recommends and patient chooses bed at      [facility] on  .  Patient to be transferred to facility by       Patient family notified on   of transfer.  Name of family member notified:        PHYSICIAN Please sign FL2     Additional Comment:     _______________________________________________ Mearl LatinNadia S Cimberly Stoffel, LCSWA 06/13/2016, 9:13 AM

## 2016-06-14 NOTE — Discharge Instructions (Signed)
Spinal Fusion, Care After °These instructions give you information about caring for yourself after your procedure. Your doctor may also give you more specific instructions. Call your doctor if you have any problems or questions after your procedure. °Follow these instructions at home: °Medicines  °· Take over-the-counter and prescription medicines only as told by your doctor. These include any medicines for pain. °· Do not drive for 24 hours if you received a sedative. °· Do not drive or use heavy machinery while taking prescription pain medicine. °· If you were prescribed an antibiotic medicine, take it as told by your doctor. Do not stop taking the antibiotic even if you start to feel better. °Surgical Cut (Incision) Care  °· Follow instructions from your doctor about how to take care of your surgical cut. Make sure you: °¨ Wash your hands with soap and water before you change your bandage (dressing). If you cannot use soap and water, use hand sanitizer. °¨ Change your bandage as told by your doctor. °¨ Leave stitches (sutures), skin glue, or skin tape (adhesive) strips in place. They may need to stay in place for 2 weeks or longer. If tape strips get loose and curl up, you may trim the loose edges. Do not remove tape strips completely unless your doctor says it is okay. °· Keep your surgical cut clean and dry. Do not take baths, swim, or use a hot tub until your doctor says it is okay. °· Check your surgical cut and the area around it every day for: °¨ Redness. °¨ Swelling. °¨ Fluid. °Physical Activity  °· Return to your normal activities as told by your doctor. Ask your doctor what activities are safe for you. Rest and protect your back as much as you can. °· Follow instructions from your doctor about how to move. Use good posture to help your spine heal. °· Do not lift anything that is heavier than 8 lb (3.6 kg) or as told by your doctor until he or she says that it is safe. Do not lift anything over your  head. °· Do not twist or bend at the waist until your doctor says it is okay. °· Avoid pushing or pulling motions. °· Do not sit or lie down in the same position for long periods of time. °· Do not start to exercise until your doctor says it is okay. Ask your doctor what kinds of exercise you can do to make your back stronger. °General instructions  °· If you were given a brace, use it as told by your doctor. °· Wear compression stockings as told by your doctor. °· Do not use tobacco products. These include cigarettes, chewing tobacco, or e-cigarettes. If you need help quitting, ask your doctor. °· Keep all follow-up visits as told by your doctor. This is important. This includes any visits with your physical therapist, if this applies. °Contact a doctor if: °· Your pain gets worse. °· Your medicine does not help your pain. °· Your legs or feet become painful or swollen. °· Your surgical cut is red, swollen, or painful. °· You have fluid, blood, or pus coming from your surgical cut. °· You feel sick to your stomach (nauseous). °· You throw up (vomit). °· Your have weakness or loss of feeling (numbness) in your legs that is new or getting worse. °· You have a fever. °· You have trouble controlling when you pee (urinate) or poop (have a bowel movement). °Get help right away if: °· Your pain is very   bad. °· You have chest pain. °· You have trouble breathing. °· You start to have a cough. °These symptoms may be an emergency. Do not wait to see if the symptoms will go away. Get medical help right away. Call your local emergency services (911 in the [place] Do not drive yourself to the hospital. °This information is not intended to replace advice given to you by your health care provider. Make sure you discuss any questions you have with your health care provider. °Document Released: 05/16/2010 Document Revised: 09/18/2015 Document Reviewed: 07/05/2014 °Elsevier Interactive Patient Education © 2017 Elsevier Inc. ° °

## 2016-06-14 NOTE — Progress Notes (Signed)
PT Cancellation Note  Patient Details Name: Rikki SpearingCarol R Knudtson MRN: [id number] DOB: 06/18/1945   Cancelled Treatment:    Reason Eval/Treat Not Completed: Patient declined, no reason specified. Pt refusing to perform any gait this session. Reports that she walked with her family earlier. Will check back as time allows.    Colin BroachSabra M. Jamisen Duerson PT, DPT  [phone]  06/14/2016, 12:40 PM

## 2016-06-14 NOTE — Progress Notes (Signed)
    Patient doing well, pre-op R leg pain is substantially improved, she has been up and walking, expected PO LBP improving slowly with PT. She is tolerating her brace well and following restrictions. She has been seen by CSW and plan is to [facility] once bed available  Physical Exam: BP 114/61 (BP Location: Right Arm)   Pulse 66   Temp 98 F (36.7 C) (Oral)   Resp 17   Ht 5\' 8"  (1.727 m)   Wt 93.9 kg (207 lb)   SpO2 94%   BMI 31.47 kg/m   Dressing in place, CDI,  pt lying on back in bed, about to eat NVI  POD #3 s/p revison L3-4 Decompression and fusion doing well.   - up with PT/OT, encourage ambulation  - TLSO brace at all times when OOB  - Percocet for pain, Valium for muscle spasms - likely d/c SNF when bed available, OK to D/C from ortho standpoint - Pt can remove outer bandage and shower over steri strips after 5 days PO

## 2016-06-14 NOTE — Discharge Summary (Signed)
Physician Discharge Summary  Patient ID: [name] Dodson MRN: [id number] DOB/AGE: 73/02/1945 73 y.o.  Admit date: 06/11/2016 Discharge date: 06/14/2016  Admission Diagnoses:  Radiculopathy  Discharge Diagnoses:  Active Problems:   Radiculopathy   Past Medical History:  Diagnosis Date  . Arthritis   . Atrial fibrillation (HCC)    afib 04/08/08 s/p DCCV 05/05/09, s/p CTI ablation 10/02/09; recurrent afib 2016 s/p DCCV 10/27/14. On flecainide, ASA. (Dr. Kandice RobinsonsKer Boyce)  . Chronic back pain    stenosis  . Dry skin   . Dyspnea   . Dysrhythmia    a fib  . GERD (gastroesophageal reflux disease)    Tums daily  . History of blood transfusion    no abnormal reaction  . History of colon polyps    benign  . Joint pain   . Nocturia   . Pneumonia    hx of  . PONV (postoperative nausea and vomiting)   . Weakness    numbness and tingling in right leg/foot    Surgeries: Procedure(s): RIGHT SIDED LUMBAR 3-4 TRANSFORAMINAL LUMBAR INTERBODY FUSION WITH INSTRUMENTATION AND ALLOGRAFT on 06/11/2016   Consultants (if any):   Discharged Condition: Improved  Hospital Course: [name] SpearingCarol R Dodson is an 71 y.o. female who was admitted 06/11/2016 with a diagnosis of <principal problem not specified> and went to the operating room on 06/11/2016 and underwent the above named procedures.    She was given perioperative antibiotics:  Anti-infectives    Start     Dose/Rate Route Frequency Ordered Stop   06/11/16 1900  ceFAZolin (ANCEF) IVPB 2g/100 mL premix     2 g 200 mL/hr over 30 Minutes Intravenous Every 8 hours 06/11/16 1538 06/12/16 0430   06/11/16 0652  ceFAZolin (ANCEF) IVPB 2g/100 mL premix     2 g 200 mL/hr over 30 Minutes Intravenous On call to O.R. 06/11/16 [id number] 06/11/16 1300    .  She was given sequential compression devices, early ambulation, and SCDS for DVT prophylaxis.  She benefited maximally from the hospital stay and there were no complications.    Recent vital signs:  Vitals:   06/14/16 0442  06/14/16 1022  BP: (!) 121/53 114/61  Pulse: 71 66  Resp: 20 17  Temp: 98.4 F (36.9 C) 98 F (36.7 C)    Recent laboratory studies:  Lab Results  Component Value Date   HGB 10.0 (L) 06/12/2016   HGB 13.1 06/06/2016   HGB 14.3 02/12/2016   Lab Results  Component Value Date   WBC 15.4 (H) 06/12/2016   PLT 192 06/12/2016   Lab Results  Component Value Date   INR 1.04 06/06/2016   Lab Results  Component Value Date   NA 138 06/06/2016   K 4.3 06/06/2016   CL 104 06/06/2016   CO2 26 06/06/2016   BUN 13 06/06/2016   CREATININE 0.73 06/06/2016   GLUCOSE 105 (H) 06/06/2016    Discharge Medications:   Allergies as of 06/14/2016      Reactions   Chlorhexidine Rash   Red rash and burning      Medication List    TAKE these medications   flecainide 100 MG tablet Commonly known as:  TAMBOCOR Take 100 mg by mouth 2 (two) times daily.   gabapentin 800 MG tablet Commonly known as:  NEURONTIN Take 800 mg by mouth 3 (three) times daily.       Diagnostic Studies: Dg Lumbar Spine 2-3 Views  Result Date: 06/11/2016 CLINICAL DATA:  Posterior surgical fusion  of L3-4. EXAM: DG C-ARM 61-120 MIN; LUMBAR SPINE - 2-3 VIEW FLUOROSCOPY TIME:  1 minutes 26 seconds. COMPARISON:  Radiograph of same day. FINDINGS: Two intraoperative fluoroscopic images of the lumbar spine demonstrate the patient be status post surgical posterior fusion of L3-4 with interbody fusion. Good alignment of vertebral bodies is noted. IMPRESSION: Status post surgical posterior fusion of L3-4. Electronically Signed   By: Lupita Raider, M.D.   On: 06/11/2016 13:21   Dg Lumbar Spine 1 View  Result Date: 06/11/2016 CLINICAL DATA:  Intraoperative lumbar localization, XLIF RIGHT L3-L4 EXAM: LUMBAR SPINE - 1 VIEW COMPARISON:  Portable exam #1 at 0903 hours compared to 02/14/2016 FINDINGS: Exam was labeled with 5 lumbar vertebra. Metallic probes via dorsal approach project dorsal to the spinous process of L2 and the  spinous process of L4. Diffuse osseous demineralization with scattered disc space narrowing and endplate spur formation. Aortic atherosclerosis. IMPRESSION: Metallic probes project dorsal to the spinous processes of L2 and L4 as above. Electronically Signed   By: Ulyses Southward M.D.   On: 06/11/2016 13:17   Dg C-arm 61-120 Min  Result Date: 06/11/2016 CLINICAL DATA:  Posterior surgical fusion of L3-4. EXAM: DG C-ARM 61-120 MIN; LUMBAR SPINE - 2-3 VIEW FLUOROSCOPY TIME:  1 minutes 26 seconds. COMPARISON:  Radiograph of same day. FINDINGS: Two intraoperative fluoroscopic images of the lumbar spine demonstrate the patient be status post surgical posterior fusion of L3-4 with interbody fusion. Good alignment of vertebral bodies is noted. IMPRESSION: Status post surgical posterior fusion of L3-4. Electronically Signed   By: Lupita Raider, M.D.   On: 06/11/2016 13:21    Disposition: SNF    Contact information for after-discharge care    Destination    [facility] .   Specialty:  Skilled Nursing Facility Contact information: [address] [place] [address] [phone]               Signed: Janee Morn, Tovia Kisner A. 06/14/2016, 2:56 PM

## 2016-06-14 NOTE — Progress Notes (Addendum)
LCSW following for disposition of needs: plan [facility]  [facility] week[facility] SW. Patient [address]  Report:  [phone]  Call placed to Peggy with admissions who ensures patient is able to come today (weekend discharge) RN called for patient who is paging MD to see if patient is medically stable for discharge.  Will continue to follow and assist with discharge planning.  3:10 PM Patient has orders discharge and medically stable to go to SNF. [facility] Patient will transport by EMS. Daughter aware by patient that she is ready for discharge, other family in room and agreeable to plan. Call placed to facility and aware of patient coming and agreeable. No other needs. DC today.  Deretha EmoryHannah Nicholes Hibler LCSW, MSW Clinical Social Work: Optician, dispensingystem Wide [employer] Coverage for :  [phone]

## 2016-06-14 NOTE — Progress Notes (Signed)
Patient given discharge instructions.  Family at bedside. Alert, verbal with no complaints.   All questions and concerns addressed.  [facility] arrival to transport patient.

## 2016-06-14 NOTE — Care Management Note (Signed)
Case Management Note  Patient Details  Name: [name] Dodson MRN: [id number] Date of Birth: 06/06/1945  Subjective/Objective:                 Patient with order to DC to SNF   Action/Plan:  DC to SNF facilitated by CSW today, see note.  Expected Discharge Date:  06/14/16               Expected Discharge Plan:  Skilled Nursing Facility  In-House Referral:  Clinical Social Work  Discharge planning Services     Post Acute Care Choice:    Choice offered to:     DME Arranged:    DME Agency:     HH Arranged:    HH Agency:     Status of Service:  Completed, signed off  If discussed at [employer] Meetings, dates discussed:    Additional Comments:  [name] SabalDebbie Shelly Spenser, RN 06/14/2016, 3:33 PM

## 2016-06-14 NOTE — Progress Notes (Signed)
Report given to [facility].

## 2016-07-11 NOTE — Addendum Note (Signed)
Addendum  created 07/11/16 0907 by Marlee Trentman, MD   Sign clinical note    

## 2018-08-09 ENCOUNTER — Other Ambulatory Visit: Payer: Self-pay

## 2018-08-12 ENCOUNTER — Other Ambulatory Visit: Payer: Self-pay

## 2018-08-13 ENCOUNTER — Encounter: Payer: Self-pay | Admitting: Medical

## 2018-08-13 ENCOUNTER — Ambulatory Visit (INDEPENDENT_AMBULATORY_CARE_PROVIDER_SITE_OTHER): Payer: Medicare Other | Admitting: Medical

## 2018-08-13 VITALS — BP 127/61 | HR 71 | Temp 98.1°F | Resp 18 | Ht 68.0 in | Wt 203.0 lb

## 2018-08-13 DIAGNOSIS — K219 Gastro-esophageal reflux disease without esophagitis: Secondary | ICD-10-CM

## 2018-08-13 DIAGNOSIS — L299 Pruritus, unspecified: Secondary | ICD-10-CM | POA: Diagnosis not present

## 2018-08-13 DIAGNOSIS — I4891 Unspecified atrial fibrillation: Secondary | ICD-10-CM | POA: Diagnosis not present

## 2018-08-13 MED ORDER — NEOMYCIN-POLYMYXIN-HC 3.5-10000-1 OT SOLN: 3.0000 [drp] | Freq: Four times a day (QID) | OTIC | 0 refills | Status: DC

## 2018-08-13 NOTE — Progress Notes (Signed)
Subjective:    Patient ID: [name] Dodson, female    DOB: 03/21/1945, 73 y.o.   MRN: [id number]  HPI  Pt in for first time.  She states she had problems with ears for one year. She states has been seen by MD who gave some drops in past that helped a little bit. She complains of intermittent itching. States describes occasional sensation of movement in ear.  Pt does not remember name of the drop.   [place] and here for one month.  Hx of atrial fibrillation in the past. She is on flecainide. She also states ablation in the past. No current cardiac symptoms.  Pt sees cardiologsit in [place]. Hx of back pain. Chronic back pain. She used to be on gapabentin. But made her to drowsy.  No history of eczema of skin.  Arthritis- hx of back pain and rt hip replacement.  gerd- pt states only takes tums at night. Controlled.     Review of Systems  Constitutional: Negative for chills, fatigue and fever.  HENT: Negative for congestion, drooling, ear discharge, ear pain, hearing loss, mouth sores, postnasal drip, rhinorrhea, sinus pressure, sinus pain and sneezing.   Eyes: Negative for redness and itching.  Respiratory: Negative for cough, chest tightness, shortness of breath and wheezing.   Cardiovascular: Negative for chest pain and palpitations.  Gastrointestinal: Negative for abdominal pain.  Musculoskeletal: Negative for back pain.  Skin: Negative for rash.  Neurological: Negative for dizziness, speech difficulty, weakness, numbness and headaches.  Hematological: Negative for adenopathy. Does not bruise/bleed easily.    Past Medical History:  Diagnosis Date  . Arthritis   . Atrial fibrillation (HCC)    afib 04/08/08 s/p DCCV 05/05/09, s/p CTI ablation 10/02/09; recurrent afib 2016 s/p DCCV 10/27/14. On flecainide, ASA. (Dr. Kandice RobinsonsKer Boyce)  . Chronic back pain    stenosis  . Dry skin   . Dyspnea   . Dysrhythmia    a fib  . GERD (gastroesophageal reflux disease)    Tums  daily  . History of blood transfusion    no abnormal reaction  . History of colon polyps    benign  . Joint pain   . Nocturia   . Pneumonia    hx of  . PONV (postoperative nausea and vomiting)   . Weakness    numbness and tingling in right leg/foot     Social History   Socioeconomic History  . Marital status: Widowed    Spouse name: Not on file  . Number of children: Not on file  . Years of education: Not on file  . Highest education level: Not on file  Occupational History  . Not on file  Social Needs  . Financial resource strain: Not on file  . Food insecurity    Worry: Not on file    Inability: Not on file  . Transportation needs    Medical: Not on file    Non-medical: Not on file  Tobacco Use  . Smoking status: Former Games developermoker  . Smokeless tobacco: Never Used  . Tobacco comment: quit smoking 14+ yrs ago  Substance and Sexual Activity  . Alcohol use: Yes    Comment: rarely  . Drug use: No  . Sexual activity: Not on file  Lifestyle  . Physical activity    Days per week: Not on file    Minutes per session: Not on file  . Stress: Not on file  Relationships  . Social connections  Talks on phone: Not on file    Gets together: Not on file    Attends religious service: Not on file    Active member of club or organization: Not on file    Attends meetings of clubs or organizations: Not on file    Relationship status: Not on file  . Intimate partner violence    Fear of current or ex partner: Not on file    Emotionally abused: Not on file    Physically abused: Not on file    Forced sexual activity: Not on file  Other Topics Concern  . Not on file  Social History Narrative  . Not on file    Past Surgical History:  Procedure Laterality Date  . cataract surgery Bilateral   . CHOLECYSTECTOMY    . COLONOSCOPY    . ESOPHAGOGASTRODUODENOSCOPY    . heart ablation    . HIP SURGERY Right    with plates/screws-partial replacement  . LUMBAR  LAMINECTOMY/DECOMPRESSION MICRODISCECTOMY N/A 02/14/2016   Procedure: LUMBAR 3-4 DECOMPRESSION;  Surgeon: Phylliss Bob, MD;  Location: [facility];  Service: Orthopedics;  Laterality: N/A;  LUMBAR 3-4 DECOMPRESSION     No family history on file.  Allergies  Allergen Reactions  . Chlorhexidine Rash    Red rash and burning    Current Outpatient Medications on File Prior to Visit  Medication Sig Dispense Refill  . flecainide (TAMBOCOR) 100 MG tablet Take 100 mg by mouth 2 (two) times daily.    Marland Kitchen gabapentin (NEURONTIN) 800 MG tablet Take 800 mg by mouth 3 (three) times daily.     No current facility-administered medications on file prior to visit.     BP 127/61 (BP Location: Left Arm, Patient Position: Sitting, Cuff Size: Normal)   Pulse 71   Temp 98.1 F (36.7 C) (Oral)   Resp 18   Ht 5\' 8"  (1.727 m)   Wt 203 lb (92.1 kg)   SpO2 98%   BMI 30.87 kg/m       Objective:   Physical Exam  General  Mental Status - Alert. General Appearance - Well groomed. Not in acute distress.  Skin Rashes- No Rashes.  HEENT Head- Normal. Ear Auditory Canal - Left- Normal. Right - Normal.Tympanic Membrane- Left- Normal. Right- Normal. Eye Sclera/Conjunctiva- Left- Normal. Right- Normal. Nose & Sinuses Nasal Mucosa- Left-  Boggy and Congested. Right-  Boggy and  Congested.Bilateral maxillary and frontal sinus pressure. Mouth & Throat Lips: Upper Lip- Normal: no dryness, cracking, pallor, cyanosis, or vesicular eruption. Lower Lip-Normal: no dryness, cracking, pallor, cyanosis or vesicular eruption. Buccal Mucosa- Bilateral- No Aphthous ulcers. Oropharynx- No Discharge or Erythema. Tonsils: Characteristics- Bilateral- No Erythema or Congestion. Size/Enlargement- Bilateral- No enlargement. Discharge- bilateral-None.  Neck Neck- Supple. No Masses.   Chest and Lung Exam Auscultation: Breath Sounds:-Clear even and unlabored.  Cardiovascular Auscultation:Rythm- Regular, rate and rhythm.  Murmurs & Other Heart Sounds:Ausculatation of the heart reveal- No Murmurs.  Lymphatic Head & Neck General Head & Neck Lymphatics: Bilateral: Description- No Localized lymphadenopathy.       Assessment & Plan:  For recent chronic itching of ears for one year will rx cortisporin otic drop. Please start and update me in 4-5 days how you are. Since this is going on for so long if sypmtoms don't improve can refer to ENT.  Continue current med for a-fib.   For gerd continue tums. If needed can use pepcid.  Follow up 4-5 days or as needed.  Reminder get flu vaccine early fall  this year.(pt she does not get but explained consider this year)  Esperanza RichtersEdward Kwasi Joung, PA-C

## 2018-08-13 NOTE — Patient Instructions (Addendum)
For recent chronic itching of ears for one year will rx cortisporin otic drop. Please start and update me in 4-5 days how you are. Since this is going on for so long if sypmtoms don't improve can refer to ENT.  Continue current med for a-fib.   For gerd continue tums. If needed can use pepcid.  Follow up 4-5 days or as needed.  Reminder get flu vaccine early fall this year.(pt she does not get but explained consider this year)

## 2018-11-09 ENCOUNTER — Other Ambulatory Visit: Payer: Self-pay

## 2018-11-10 ENCOUNTER — Other Ambulatory Visit: Payer: Self-pay

## 2018-11-10 ENCOUNTER — Telehealth: Payer: Self-pay | Admitting: Medical

## 2018-11-10 ENCOUNTER — Ambulatory Visit (HOSPITAL_BASED_OUTPATIENT_CLINIC_OR_DEPARTMENT_OTHER)
Admission: RE | Admit: 2018-11-10 | Discharge: 2018-11-10 | Disposition: A | Discharge status: Home / Self Care | Payer: Medicare Other | Source: Ambulatory Visit | Attending: Medical | Admitting: Medical

## 2018-11-10 ENCOUNTER — Ambulatory Visit (INDEPENDENT_AMBULATORY_CARE_PROVIDER_SITE_OTHER): Payer: Medicare Other | Admitting: Medical

## 2018-11-10 ENCOUNTER — Encounter: Payer: Self-pay | Admitting: Medical

## 2018-11-10 VITALS — BP 146/78 | HR 60 | Temp 96.8°F | Resp 16 | Ht 68.0 in | Wt 209.2 lb

## 2018-11-10 DIAGNOSIS — R0600 Dyspnea, unspecified: Secondary | ICD-10-CM

## 2018-11-10 DIAGNOSIS — M79604 Pain in right leg: Secondary | ICD-10-CM | POA: Diagnosis present

## 2018-11-10 DIAGNOSIS — M7989 Other specified soft tissue disorders: Secondary | ICD-10-CM

## 2018-11-10 DIAGNOSIS — M79605 Pain in left leg: Secondary | ICD-10-CM

## 2018-11-10 DIAGNOSIS — M25561 Pain in right knee: Secondary | ICD-10-CM | POA: Insufficient documentation

## 2018-11-10 DIAGNOSIS — I4891 Unspecified atrial fibrillation: Secondary | ICD-10-CM

## 2018-11-10 LAB — COMPREHENSIVE METABOLIC PANEL WITH GFR
ALT: 14 U/L (ref 0–35)
AST: 16 U/L (ref 0–37)
Albumin: 4.2 g/dL (ref 3.5–5.2)
Alkaline Phosphatase: 68 U/L (ref 39–117)
BUN: 11 mg/dL (ref 6–23)
CO2: 29 meq/L (ref 19–32)
Calcium: 10.3 mg/dL (ref 8.4–10.5)
Chloride: 103 meq/L (ref 96–112)
Creatinine, Ser: 0.74 mg/dL (ref 0.40–1.20)
GFR: 76.35 mL/min (ref >60.00)
Glucose, Bld: 93 mg/dL (ref 70–99)
Potassium: 4.2 meq/L (ref 3.5–5.1)
Sodium: 138 meq/L (ref 135–145)
Total Bilirubin: 0.4 mg/dL (ref 0.2–1.2)
Total Protein: 7.7 g/dL (ref 6.0–8.3)

## 2018-11-10 LAB — BRAIN NATRIURETIC PEPTIDE: Pro B Natriuretic peptide (BNP): 40 pg/mL (ref 0.0–100.0)

## 2018-11-10 MED ORDER — CLOTRIMAZOLE 1 % EX SOLN: CUTANEOUS | 0 refills | Status: DC

## 2018-11-10 NOTE — Telephone Encounter (Signed)
Will you check. This is pt of Dr. Lorelei Pont. I have seen her twice. Want her to have follow up with Dr. Lorelei Pont in one month. She lives part time in [place], has cardiologist in [place] and she has seen me twice. I want her to meet Dr. Lorelei Pont as important in event something comes up. Will you call pt tomorrow and explain.  Or next visit needs to see pcp. Since Dr Lorelei Pont only here specific days better to scheduled out.

## 2018-11-10 NOTE — Patient Instructions (Addendum)
For your recent bilateral lower extremity pain, right lower extremity mild swelling and dyspnea on exertion, I want to  go ahead and get bilateral lower extremity ultrasounds, chest x-ray and BNP.  I am happy to hear that you do have appointment with your cardiologist on October 12.  Your oxygen level was sustained at good level today on ambulation.  Also pulse within reasonable range.  For right knee pain, I went ahead and placed x-ray order for that to be done today.  Your ultrasound of lower extremities scheduled for 230 this afternoon.  Please keep your cell phone charged and volume up so I can call you with the results.  This is very important in the event of positive finding/DVT.  After review of imaging studies, CMP and BNP will determine treatment.  Follow-up with PCP in about a month or as needed.  Possibly sooner if positive findings on work-up

## 2018-11-10 NOTE — Addendum Note (Signed)
Addended by: Hinton Dyer on: 11/10/2018 04:42 PM   Modules accepted: Orders

## 2018-11-10 NOTE — Telephone Encounter (Signed)
Sent rx to pharmacy

## 2018-11-10 NOTE — Progress Notes (Signed)
Subjective:    Patient ID: [name] Dodson, female    DOB: [birth year], 73 y.o.   MRN: [id number]  HPI  Patient reports about 7 days ago she noticed some right ankle swelling. Over the past day or so the swelling/pain has gone further up the leg and is now in left lower leg as well. Reports pain in both legs 7/10 with walking, pain improves with rest. Pain is described as shooting, sharp pain. Entire leg is painful, anterior and posterior, upper and lower. Left ankle has not started swelling or hurting. Pain is impairing activity, but does not disrupt sleep. No recent injuries or falls. Reports shortness of breath, mostly with activity. Denies cough. Reports the pain, swelling, weakness started in her right ankle and worked its way up her right leg and has now crossed to left lower leg as well. No redness or warmth or rashes.  Pt has atrial fib. See cardiologist once a year. Maybe this month. On flecanide.  Pt leg more swollen all day on rt side for 7 days. Swelling and moderate-severe pain. Less pain when lies down.  Rt side leg pain but no swelling.  Pt has no acute weight but some gradual since onset pandemic.  Pt has been easily short winded for years. Feels like getting worse. No wheeze on ambulation.   Itchy ears for the past year. She has tried cortisporin drops which did not help. No drainage or discharge, no pain.  States she cleans her ears with baby oil and vitamin E oil which temporarily relieves the itching, but it always comes right back.  Pulse on walk maintained 02 sat  96%-97%. Pulse max 75.  Hx of smoking for years. Stopped 14 yr ago. No wheezing  No back pain or radicualar type pain..   Review of Systems  Constitutional: Negative for chills, fatigue, fever and unexpected weight change.  HENT: Negative for congestion, ear discharge, ear pain, sneezing and sore throat.        Bilateral ear canal itching  Eyes: Negative for visual disturbance.  Respiratory: Positive for  shortness of breath. Negative for cough and chest tightness.        Shortness of breath with activity  Cardiovascular: Positive for leg swelling. Negative for chest pain.  Gastrointestinal: Negative for abdominal pain and nausea.  Endocrine: Negative for polydipsia, polyphagia and polyuria.  Genitourinary: Negative for dysuria, frequency and urgency.  Musculoskeletal: Positive for myalgias.       Bilateral leg pain, generalized to entire legs  Skin: Negative for rash.  Neurological: Positive for weakness.       Bilateral lower extremity weakness. Right leg for the past week. Left leg starting yesterday.  Psychiatric/Behavioral: Negative for sleep disturbance.       Objective:   Physical Exam  General Mental Status- Alert. General Appearance- Not in acute distress.   Skin General: Color- Normal Color. Moisture- Normal Moisture.  Neck Carotid Arteries- Normal color. Moisture- Normal Moisture. No carotid bruits. No JVD.  Chest and Lung Exam Auscultation: Breath Sounds:-Normal.  Cardiovascular Auscultation:Rythm- Regular. Murmurs & Other Heart Sounds:Auscultation of the heart reveals- No Murmurs.  Abdomen Inspection:-Inspection Normal. Palpation/Percussion:Note:No mass. Palpation and Percussion of the abdomen reveal- Non Tender, Non Distended + BS, no rebound or guarding.   Neurologic Cranial Nerve exam:- CN III-XII intact(No nystagmus), symmetric smile. Drift Test:- No drift. Romberg Exam:- Negative.  Heal to Toe Gait exam:-Normal. Finger to Nose:- Normal/Intact Strength:- 5/5 equal and symmetric strength both upper and lower extremities.  Lower ext- mild swelling rt calf. Left calf/lower ext not swollen on inspection.  Skin- rt upper thigh. Skin biopsy area clean. No dc or redness.     Assessment & Plan:  Appointment 2:30. [phone] daughter  For your recent bilateral lower extremity pain, right lower extremity mild swelling and dyspnea on exertion, I want to   go ahead and get bilateral lower extremity ultrasounds, chest x-ray and BNP.  I am happy to hear that you do have appointment with your cardiologist on October 12.  Your oxygen level was sustained at good level today on ambulation.  Also pulse within reasonable range.  For right knee pain, I went ahead and placed x-ray order for that to be done today.  Your ultrasound of lower extremities scheduled for 230 this afternoon.  Please keep your cell phone charged and volume up so I can call you with the results.  This is very important in the event of positive finding/DVT.  After review of imaging studies, CMP and BNP will determine treatment.  Follow-up with PCP in about a month or as needed.  Possibly sooner if positive findings on work-up  40 minutes spent with pt. 50% of time spent counseling pt on plan going forward.   NP student Caleen Jobs interviewed pt. I interviewed pt separtley and made tx decisions.   Mackie Pai, PA-C

## 2018-11-10 NOTE — Telephone Encounter (Signed)
Will you call this in to pt pharmacy.  Lotrimin solution/ear drops1% 1-2 drops twice a day to both ears. 10 ml.  It is not in epic but per pharmacy downstairs available over the counter. Maybe her insurance pays for. Ask her pharmacy. How much it cost under her insurance.

## 2018-11-13 ENCOUNTER — Telehealth: Payer: Self-pay | Admitting: Medical

## 2018-11-13 DIAGNOSIS — E2839 Other primary ovarian failure: Secondary | ICD-10-CM

## 2018-11-13 DIAGNOSIS — M8580 Other specified disorders of bone density and structure, unspecified site: Secondary | ICD-10-CM

## 2018-11-13 NOTE — Telephone Encounter (Signed)
Dexa scan order placed

## 2018-11-15 NOTE — Telephone Encounter (Signed)
Is this the right pt?  This pt has never seen Dr. Lorelei Pont.

## 2018-11-16 ENCOUNTER — Telehealth: Payer: Self-pay | Admitting: Medical

## 2018-11-16 ENCOUNTER — Other Ambulatory Visit: Payer: Self-pay

## 2018-11-16 ENCOUNTER — Ambulatory Visit (HOSPITAL_BASED_OUTPATIENT_CLINIC_OR_DEPARTMENT_OTHER)
Admission: RE | Admit: 2018-11-16 | Discharge: 2018-11-16 | Disposition: A | Discharge status: Home / Self Care | Payer: Medicare Other | Source: Ambulatory Visit | Attending: Medical | Admitting: Medical

## 2018-11-16 DIAGNOSIS — E2839 Other primary ovarian failure: Secondary | ICD-10-CM | POA: Insufficient documentation

## 2018-11-16 DIAGNOSIS — M8580 Other specified disorders of bone density and structure, unspecified site: Secondary | ICD-10-CM | POA: Insufficient documentation

## 2018-11-16 MED ORDER — ALENDRONATE SODIUM 10 MG PO TABS: 10.0000 mg | ORAL_TABLET | Freq: Every day | ORAL | 11 refills | Status: DC

## 2018-11-16 NOTE — Telephone Encounter (Signed)
Rx alondrenate sent to pt pharmacy.

## 2018-11-16 NOTE — Telephone Encounter (Signed)
I think she intended to see Dr. Lorelei Pont but has been scheduled with me twice. I just want her to see who she intends to see ultimately. I get the impression from her I was not who they wanted to see.  I thought I saw Dr. Lorelei Pont name under pcp both times I saw her. It looks like it was switched to me.  Just clarify with pt and family this was there intent.   They also mentioned to me that pt had seen Dr. Etter Sjogren years ago.

## 2018-11-24 ENCOUNTER — Ambulatory Visit: Payer: Medicare Other | Admitting: Medical

## 2018-11-24 ENCOUNTER — Other Ambulatory Visit: Payer: Self-pay

## 2018-11-24 ENCOUNTER — Encounter: Payer: Self-pay | Admitting: Medical

## 2018-11-24 NOTE — Progress Notes (Signed)
Pt declined virtual phone visit. Not necessary as pt already started fosamax. So will just my chart them explaining no visit needed. I had only wanted visit since last message I got was pt did not want to take since she did not take pills? No charge

## 2018-11-24 NOTE — Patient Instructions (Signed)
No charge. 

## 2018-12-24 ENCOUNTER — Telehealth: Payer: Self-pay | Admitting: Medical

## 2018-12-24 NOTE — Telephone Encounter (Signed)
Patient's daughter is calling to request a transfer of Care from [facility] to Dr. Carollee Herter. Patient's daughter is wanting the patient to have a MD.

## 2018-12-24 NOTE — Telephone Encounter (Signed)
Patients daughters name not given or call back number. There is a daughter in her dpr but I want to make sure its the right one. Dr. Etter Sjogren is not an MD, she is a DO. Also she is not accepting new patients at this time.

## 2018-12-28 ENCOUNTER — Encounter: Payer: Self-pay | Admitting: Family Medicine

## 2018-12-28 ENCOUNTER — Ambulatory Visit (INDEPENDENT_AMBULATORY_CARE_PROVIDER_SITE_OTHER): Payer: Medicare Other | Admitting: Family Medicine

## 2018-12-28 ENCOUNTER — Other Ambulatory Visit: Payer: Self-pay

## 2018-12-28 VITALS — BP 130/70 | HR 71 | Temp 97.0°F | Resp 18 | Ht 68.0 in | Wt 204.6 lb

## 2018-12-28 DIAGNOSIS — L089 Local infection of the skin and subcutaneous tissue, unspecified: Secondary | ICD-10-CM

## 2018-12-28 DIAGNOSIS — T148XXA Other injury of unspecified body region, initial encounter: Secondary | ICD-10-CM | POA: Diagnosis not present

## 2018-12-28 DIAGNOSIS — H9203 Otalgia, bilateral: Secondary | ICD-10-CM | POA: Diagnosis not present

## 2018-12-28 DIAGNOSIS — Z23 Encounter for immunization: Secondary | ICD-10-CM | POA: Diagnosis not present

## 2018-12-28 MED ORDER — CEPHALEXIN 500 MG PO CAPS: 500.0000 mg | ORAL_CAPSULE | Freq: Two times a day (BID) | ORAL | 0 refills | Status: DC

## 2018-12-28 MED ORDER — ACETIC ACID 2 % OT SOLN: 4.0000 [drp] | OTIC | 0 refills | Status: DC

## 2018-12-28 NOTE — Patient Instructions (Signed)
Wound Care, Adult Taking care of your wound properly can help to prevent pain, infection, and scarring. It can also help your wound to heal more quickly. How to care for your wound Wound care      Follow instructions from your health care provider about how to take care of your wound. Make sure you: ? Wash your hands with soap and water before you change the bandage (dressing). If soap and water are not available, use hand sanitizer. ? Change your dressing as told by your health care provider. ? Leave stitches (sutures), skin glue, or adhesive strips in place. These skin closures may need to stay in place for 2 weeks or longer. If adhesive strip edges start to loosen and curl up, you may trim the loose edges. Do not remove adhesive strips completely unless your health care provider tells you to do that.  Check your wound area every day for signs of infection. Check for: ? Redness, swelling, or pain. ? Fluid or blood. ? Warmth. ? Pus or a bad smell.  Ask your health care provider if you should clean the wound with mild soap and water. Doing this may include: ? Using a clean towel to pat the wound dry after cleaning it. Do not rub or scrub the wound. ? Applying a cream or ointment. Do this only as told by your health care provider. ? Covering the incision with a clean dressing.  Ask your health care provider when you can leave the wound uncovered.  Keep the dressing dry until your health care provider says it can be removed. Do not take baths, swim, use a hot tub, or do anything that would put the wound underwater until your health care provider approves. Ask your health care provider if you can take showers. You may only be allowed to take sponge baths. Medicines   If you were prescribed an antibiotic medicine, cream, or ointment, take or use the antibiotic as told by your health care provider. Do not stop taking or using the antibiotic even if your condition improves.  Take  over-the-counter and prescription medicines only as told by your health care provider. If you were prescribed pain medicine, take it 30 or more minutes before you do any wound care or as told by your health care provider. General instructions  Return to your normal activities as told by your health care provider. Ask your health care provider what activities are safe.  Do not scratch or pick at the wound.  Do not use any products that contain nicotine or tobacco, such as cigarettes and e-cigarettes. These may delay wound healing. If you need help quitting, ask your health care provider.  Keep all follow-up visits as told by your health care provider. This is important.  Eat a diet that includes protein, vitamin A, vitamin C, and other nutrient-rich foods to help the wound heal. ? Foods rich in protein include meat, dairy, beans, nuts, and other sources. ? Foods rich in vitamin A include carrots and dark green, leafy vegetables. ? Foods rich in vitamin C include citrus, tomatoes, and other fruits and vegetables. ? Nutrient-rich foods have protein, carbohydrates, fat, vitamins, or minerals. Eat a variety of healthy foods including vegetables, fruits, and whole grains. Contact a health care provider if:  You received a tetanus shot and you have swelling, severe pain, redness, or bleeding at the injection site.  Your pain is not controlled with medicine.  You have redness, swelling, or pain around the wound.    You have fluid or blood coming from the wound.  Your wound feels warm to the touch.  You have pus or a bad smell coming from the wound.  You have a fever or chills.  You are nauseous or you vomit.  You are dizzy. Get help right away if:  You have a red streak going away from your wound.  The edges of the wound open up and separate.  Your wound is bleeding, and the bleeding does not stop with gentle pressure.  You have a rash.  You faint.  You have trouble breathing.  Summary  Always wash your hands with soap and water before changing your bandage (dressing).  To help with healing, eat foods that are rich in protein, vitamin A, vitamin C, and other nutrients.  Check your wound every day for signs of infection. Contact your health care provider if you suspect that your wound is infected. This information is not intended to replace advice given to you by your health care provider. Make sure you discuss any questions you have with your health care provider. Document Released: 10/30/2007 Document Revised: 05/10/2018 Document Reviewed: 08/07/2015 Elsevier Patient Education  2020 [employer]  

## 2018-12-28 NOTE — Progress Notes (Signed)
Patient ID: [name] SpearingCarol R Dodson, female    DOB: 05/25/1945  Age: 73 y.o. MRN: [id number]    Subjective:  Subjective  HPI [name] SpearingCarol R Dodson presents for c/o wound R forearm after a blind fell on her arm.  Her daughter wrapped it but now the guaze is stuck to it.   Pt also c/o itchy ears.   abx from PA did not help.    Review of Systems  Constitutional: Negative for appetite change, diaphoresis, fatigue and unexpected weight change.  HENT: Positive for ear pain. Negative for congestion, dental problem, drooling, ear discharge, sinus pressure, sinus pain and sneezing.   Eyes: Negative for pain, redness and visual disturbance.  Respiratory: Negative for cough, chest tightness, shortness of breath and wheezing.   Cardiovascular: Negative for chest pain, palpitations and leg swelling.  Endocrine: Negative for cold intolerance, heat intolerance, polydipsia, polyphagia and polyuria.  Genitourinary: Negative for difficulty urinating, dysuria and frequency.  Skin: Positive for wound.  Neurological: Negative for dizziness, light-headedness, numbness and headaches.    History Past Medical History:  Diagnosis Date   Arthritis    Atrial fibrillation (HCC)    afib 04/08/08 s/p DCCV 05/05/09, s/p CTI ablation 10/02/09; recurrent afib 2016 s/p DCCV 10/27/14. On flecainide, ASA. (Dr. Kandice RobinsonsKer Boyce)   Chronic back pain    stenosis   Dry skin    Dyspnea    Dysrhythmia    a fib   GERD (gastroesophageal reflux disease)    Tums daily   History of blood transfusion    no abnormal reaction   History of colon polyps    benign   Joint pain    Nocturia    Pneumonia    hx of   PONV (postoperative nausea and vomiting)    Weakness    numbness and tingling in right leg/foot    She has a past surgical history that includes cataract surgery (Bilateral); Cholecystectomy; Hip surgery (Right); Colonoscopy; Esophagogastroduodenoscopy; heart ablation; and Lumbar laminectomy/decompression microdiscectomy (N/A,  02/14/2016).   Her family history is not on file.She reports that she has quit smoking. She has never used smokeless tobacco. She reports current alcohol use. She reports that she does not use drugs.  Current Outpatient Medications on File Prior to Visit  Medication Sig Dispense Refill   alendronate (FOSAMAX) 10 MG tablet Take 1 tablet (10 mg total) by mouth daily before breakfast. Take with a full glass of water on an empty stomach. 30 tablet 11   clotrimazole (LOTRIMIN) 1 % external solution 1-2 drops twice a day to both ears 10 mL 0   flecainide (TAMBOCOR) 100 MG tablet Take 100 mg by mouth 2 (two) times daily.     neomycin-polymyxin-hydrocortisone (CORTISPORIN) OTIC solution Place 3 drops into both ears 4 (four) times daily. 10 mL 0   No current facility-administered medications on file prior to visit.      Objective:  Objective  Physical Exam Vitals signs and nursing note reviewed.  Constitutional:      Appearance: She is well-developed.  HENT:     Head: Normocephalic and atraumatic.     Ears:   Eyes:     Conjunctiva/sclera: Conjunctivae normal.  Neck:     Musculoskeletal: Normal range of motion and neck supple.     Thyroid: No thyromegaly.     Vascular: No carotid bruit or JVD.  Cardiovascular:     Rate and Rhythm: Normal rate and regular rhythm.     Heart sounds: Normal heart sounds. No murmur.  Pulmonary:     Effort: Pulmonary effort is normal. No respiratory distress.     Breath sounds: Normal breath sounds. No wheezing or rales.  Chest:     Chest wall: No tenderness.  Skin:    Findings: Abrasion and wound present.       Neurological:     Mental Status: She is alert and oriented to person, place, and time.    BP 130/70 (BP Location: Left Arm, Patient Position: Sitting, Cuff Size: Normal)    Pulse 71    Temp (!) 97 F (36.1 C) (Temporal)    Resp 18    Ht 5\' 8"  (1.727 m)    Wt 204 lb 9.6 oz (92.8 kg)    SpO2 97%    BMI 31.11 kg/m  Wt Readings from Last 3  Encounters:  12/28/18 204 lb 9.6 oz (92.8 kg)  11/10/18 209 lb 3.2 oz (94.9 kg)  08/13/18 203 lb (92.1 kg)     Lab Results  Component Value Date   WBC 15.4 (H) 06/12/2016   HGB 10.0 (L) 06/12/2016   HCT 29.2 (L) 06/12/2016   PLT 192 06/12/2016   GLUCOSE 93 11/10/2018   ALT 14 11/10/2018   AST 16 11/10/2018   NA 138 11/10/2018   K 4.2 11/10/2018   CL 103 11/10/2018   CREATININE 0.74 11/10/2018   BUN 11 11/10/2018   CO2 29 11/10/2018   INR 1.04 06/06/2016    Dg Bone Density  Result Date: 11/16/2018 EXAM: DUAL X-RAY ABSORPTIOMETRY (DXA) FOR BONE MINERAL DENSITY IMPRESSION: Mackie Pai Your patient Manvi Guilliams completed a BMD test on 11/16/2018 using the [employer] (analysis version: 16.SP2) manufactured by [employer]. The following summarizes the results of our evaluation. [place] PATIENT: Name: Nanci, Lakatos Patient ID: [id number] Birth Date: 1945-05-05 Height: 67.0 in. Gender: Female Measured: 11/16/2018 Weight: 207.4 lbs. Indications: Advanced Age, Caucasian, Estrogen Deficiency, Low Calcium Intake, Post Menopausal, Previous Tobacco User Fractures: Hip Treatments: ASSESSMENT: The BMD measured at Femur Neck is 0.716 g/cm2 with a T-score of -2.3. This patient is considered osteopenic according to [facility] criteria. Lumbar spine was not utilized due to advanced degenerative changes and surgical hardware. The right hip was not scanned due to a hip replacement. The scan quality is good. Site Region Measured Date Measured Age WHO YA BMD Classification T-score DualFemur Neck 11/16/2018 75.7 years Osteopenia -2.3 0.716 g/cm2 Left Forearm Radius 33% 11/16/2018 75.7 Osteopenia -1.9 0.709 g/cm2 World Health Organization [facility]) criteria for post-menopausal, Caucasian Women: Normal       T-score at or above -1 SD Osteopenia   T-score between -1 and -2.5 SD Osteoporosis T-score at or below -2.5 SD RECOMMENDATION:1. All patients should optimize calcium and vitamin D  intake. 2. Consider FDA-approved medical therapies in postmenopausal women and men aged 26 years and older, based on the following: a. A hip or vertebral(clinical or morphometric) fracture. b. T-Score < -2.5 at the femoral neck or spine after appropriate evaluation to exclude secondary causes c. Low bone mass (T-score between -1.0 and -2.5 at the femoral neck or spine) and a 10 year probability of a hip fracture >3% or a 10 year probability of major osteoporosis-related fracture > 20% based on the [place]-adapted WHO algorithm d. Clinical judgement and/or patient preferences may indicate treatment for people with 10-year fracture probabilities above or below these levels FOLLOW-UP: Patients with diagnosis of osteoporosis or at high risk for fracture should have regular bone mineral density tests. For patients  eligible for Medicare, routine testing is allowed once every 2 years. The testing frequency can be increased to one year for patients who have rapidly progressing disease, those who are receiving or discontinuing medical therapy to restore bone mass, or have additional risk factors. I have reviewed this report and agree with the above findings. [facility] Patient: [name] Dodson Referring Physician: Esperanza Richters Birth Date: 1945/06/12 [age]:       [age] Patient ID: [id number] Height: 67.0 in. Weight: 207.4 lbs. Measured: 11/16/2018 12:00:57 PM (16 SP 2) Gender: Female Ethnicity: White Analyzed: 11/16/2018 12:08:49 PM (16 SP 2) FRAX* 10-year Probability of Fracture Based on femoral neck BMD: Femur (Left) Major Osteoporotic Fracture: 14.9% Hip Fracture:                4.4% Population:                  [place] (Caucasian) Risk Factors:                None *FRAX is a Armed forces logistics/support/administrative officer of the [employer] for Metabolic Bone Disease, a World Science writer (WHO) [employer]. ASSESSMENT: The probability of a major osteoporotic fracture is 14.9% within the next ten years.  The probability of a hip fracture is 4.4% within the next ten years. Electronically Signed   By: Charlett Nose M.D.   On: 11/16/2018 15:09     Assessment & Plan:  Plan  I am having [name] Dodson start on cephALEXin and acetic acid. I am also having her maintain her flecainide, neomycin-polymyxin-hydrocortisone, clotrimazole, and alendronate.  Meds ordered this encounter  Medications   cephALEXin (KEFLEX) 500 MG capsule    Sig: Take 1 capsule (500 mg total) by mouth 2 (two) times daily.    Dispense:  20 capsule    Refill:  0   acetic acid 2 % otic solution    Sig: Place 4 drops into both ears every 3 (three) hours.    Dispense:  15 mL    Refill:  0    Problem List Items Addressed This Visit    None    Visit Diagnoses    Wound infection    -  Primary   Relevant Medications   cephALEXin (KEFLEX) 500 MG capsule   Other Relevant Orders   Tdap vaccine greater than or equal to 7yo IM (Completed)   Need for tetanus booster       Relevant Orders   Tdap vaccine greater than or equal to 7yo IM (Completed)   Otalgia of both ears       Relevant Medications   acetic acid 2 % otic solution   Other Relevant Orders   Ambulatory referral to ENT      Follow-up: Return if symptoms worsen or fail to improve.  Donato Schultz, DO

## 2019-01-10 DIAGNOSIS — H60333 Swimmer's ear, bilateral: Secondary | ICD-10-CM | POA: Insufficient documentation

## 2020-01-01 IMAGING — DX DG CHEST 2V
3 series · 3 of 3 positions shown · non-contrast
Comparison: 09/23/2004

CLINICAL DATA: Dyspnea on exertion

EXAM:
CHEST - 2 VIEW

[chest pa (1 of 2)]
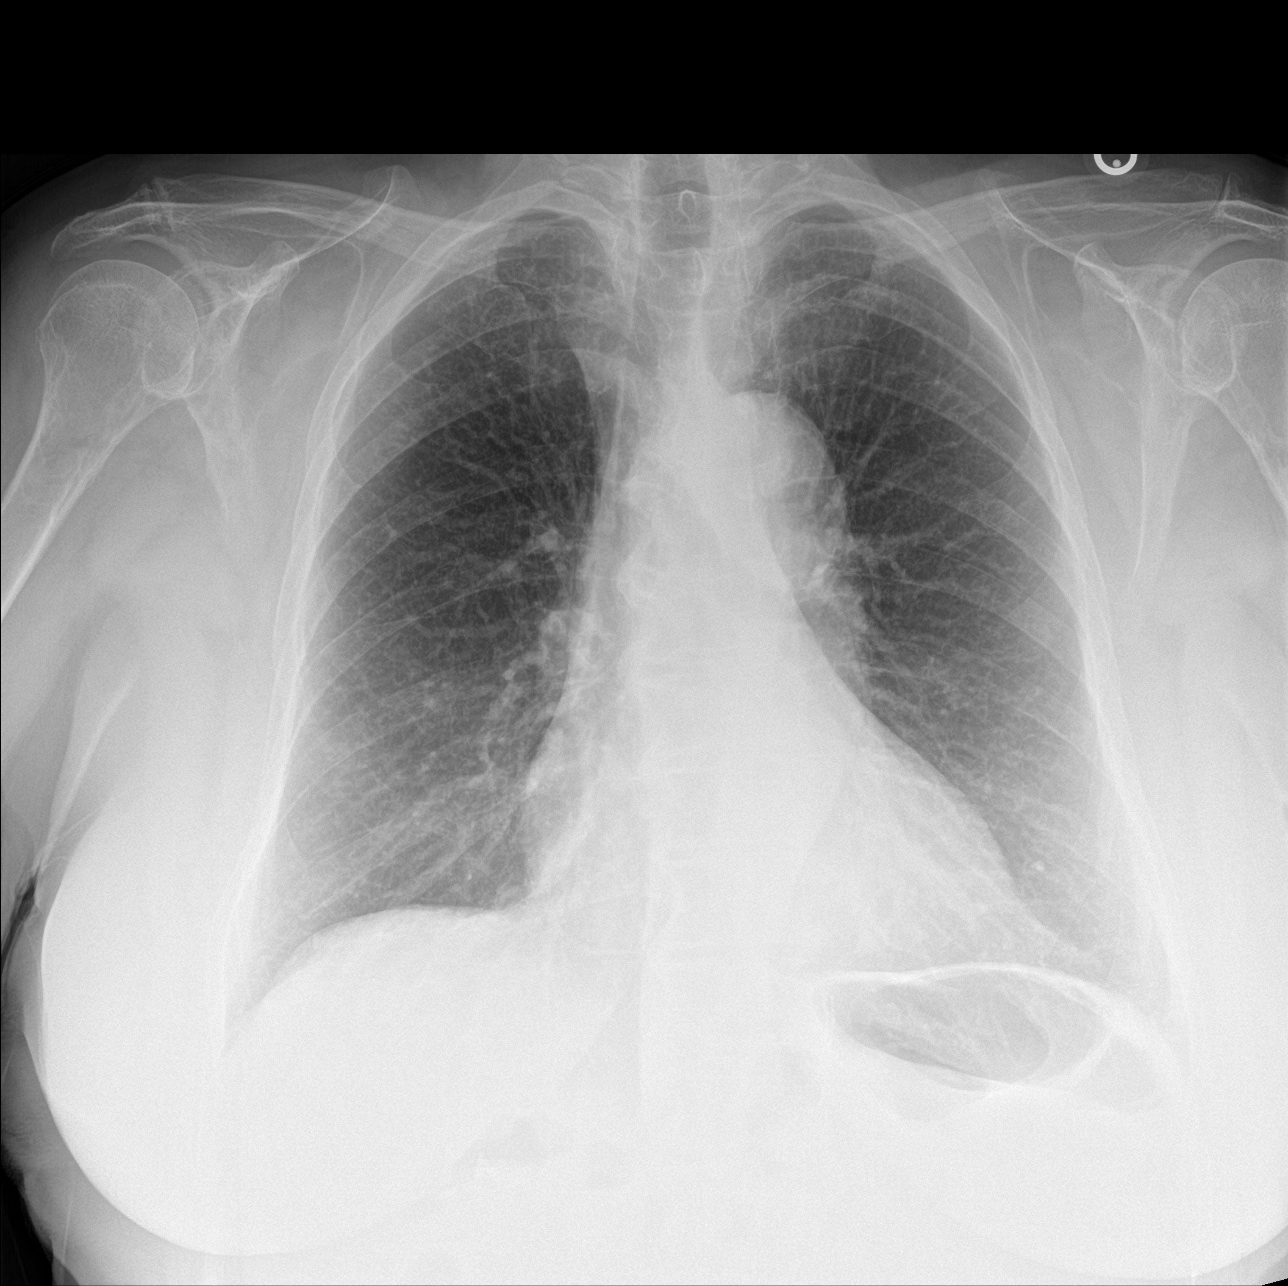

[chest pa (2 of 2)]
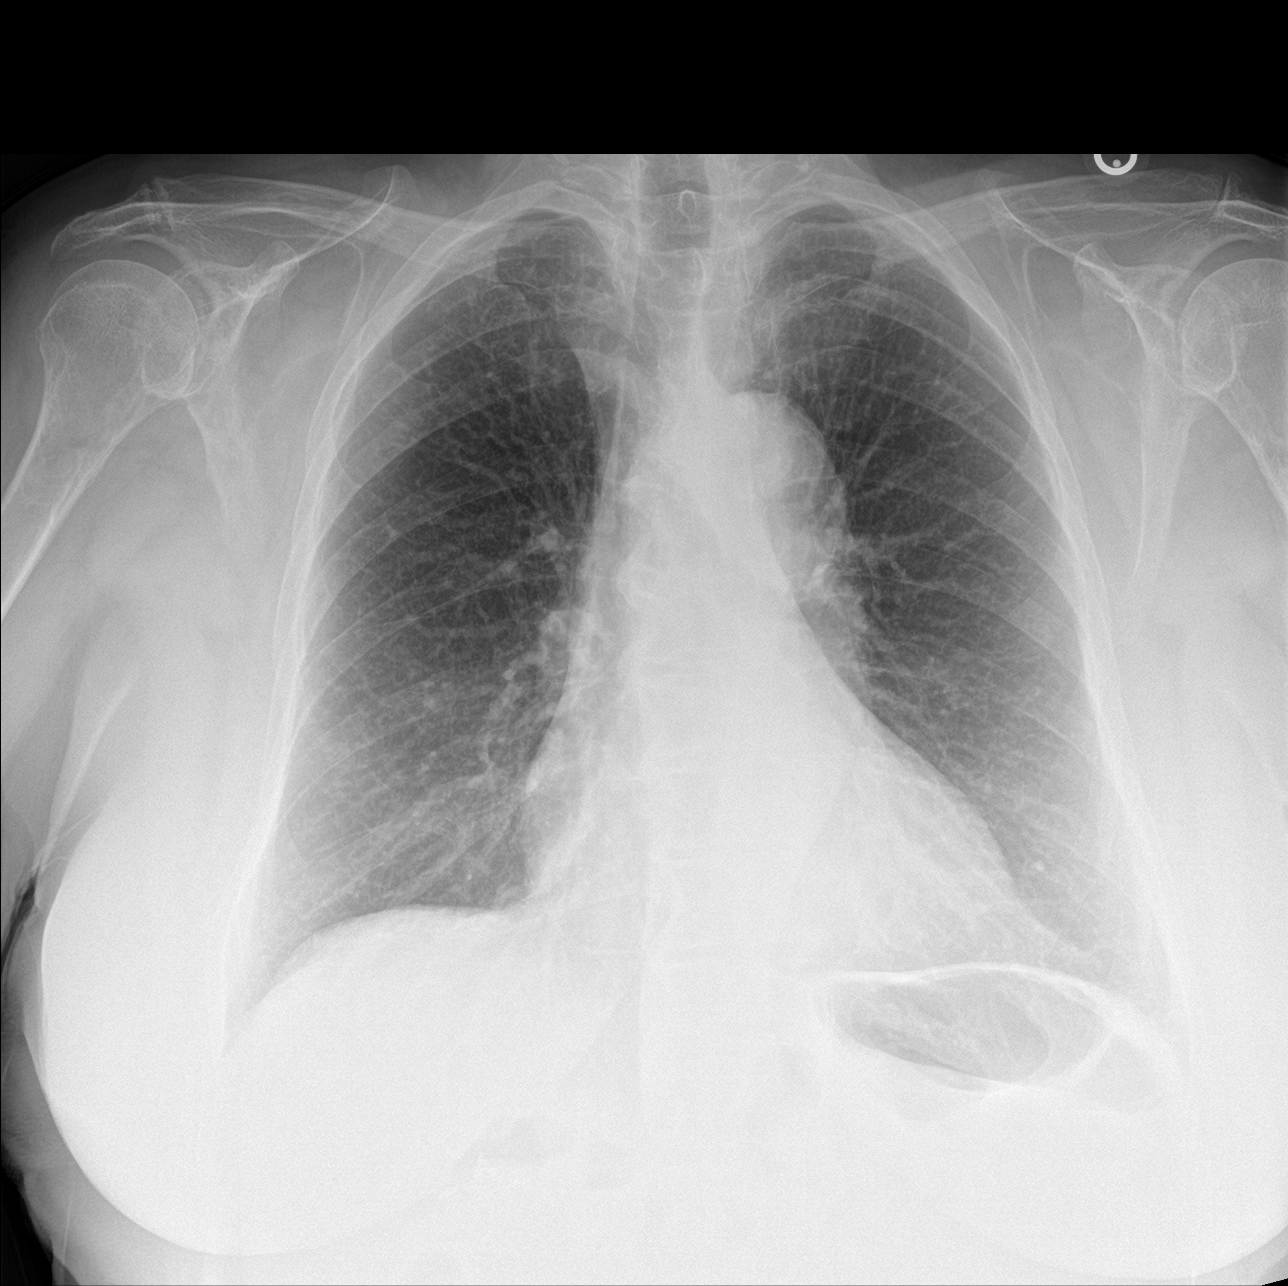

[chest lat]
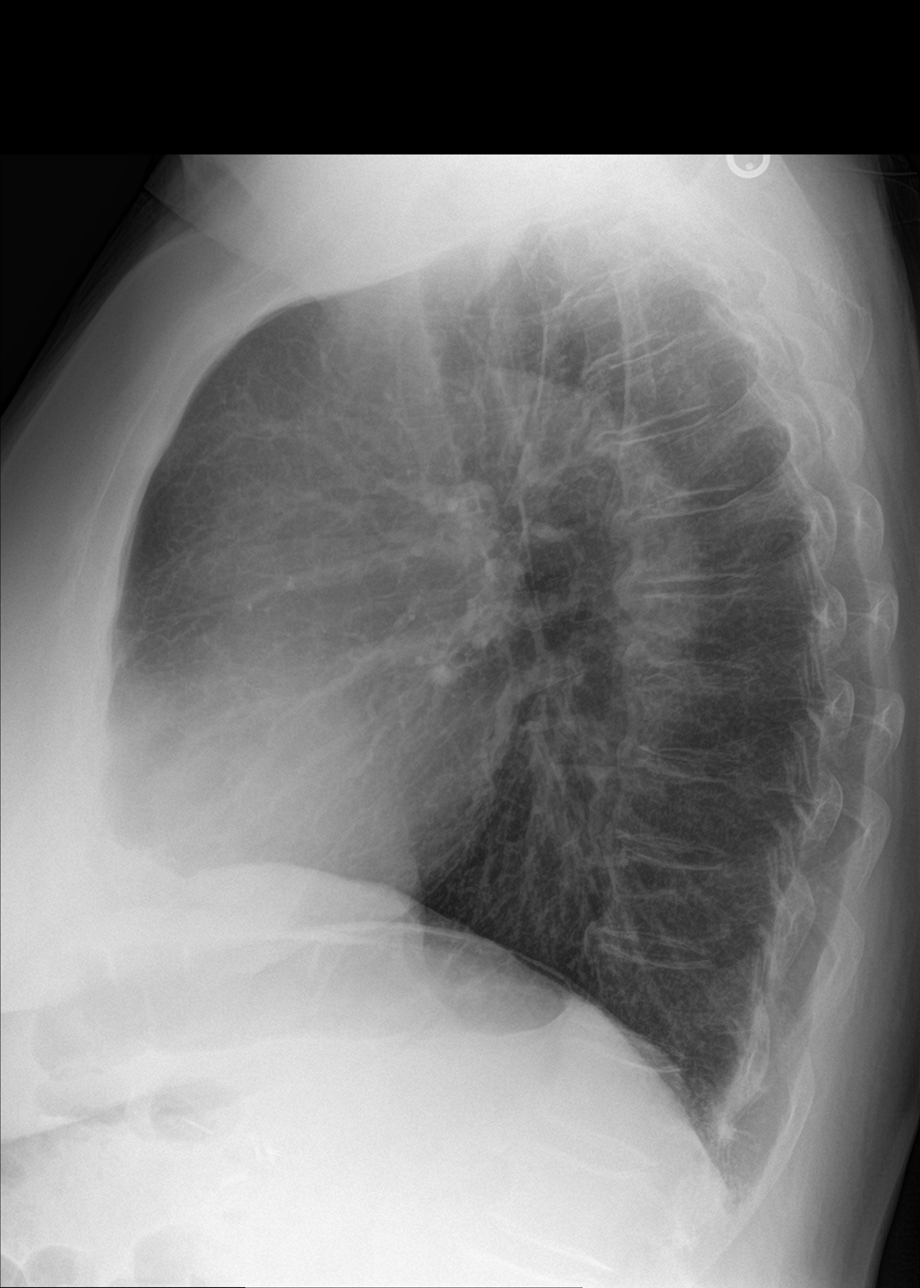

[3 of 3 positions shown; findings below may reference images not displayed]

FINDINGS: Mild cardiomegaly. Both lungs are clear. Disc degenerative disease
of the thoracic spine.
IMPRESSION: Mild cardiomegaly without acute abnormality of the lungs.

## 2020-10-19 ENCOUNTER — Telehealth: Payer: Self-pay | Admitting: Medical

## 2020-10-19 NOTE — Telephone Encounter (Signed)
Patient's daughter called to make an appointment for her mom with [facility]. I informed her that the patient's PCP was [facility]agu[facility], so per protocol we usually make appointments with the PCP unless unable to for that day. She stated that there had been a TOC done before, and that's why she had seen [facility] last time she was at the office (12/28/2018). I informed daughter that Laury Axon had seen the patient because [facility] was not in office, and that per the notes on her chart the CMA had explained that [facility] was not taking new patients. After double checking with CMA, daughter was also informed that she is still not taking any new patients. She stated she understood and did not want an appointment with [facility].

## 2021-10-18 ENCOUNTER — Ambulatory Visit (INDEPENDENT_AMBULATORY_CARE_PROVIDER_SITE_OTHER): Payer: Medicare Other | Admitting: Nurse Practitioner

## 2021-10-18 ENCOUNTER — Encounter: Payer: Self-pay | Admitting: Nurse Practitioner

## 2021-10-18 VITALS — BP 100/72 | HR 72 | Temp 97.4°F | Wt 208.2 lb

## 2021-10-18 DIAGNOSIS — R300 Dysuria: Secondary | ICD-10-CM | POA: Diagnosis not present

## 2021-10-18 DIAGNOSIS — N3001 Acute cystitis with hematuria: Secondary | ICD-10-CM | POA: Diagnosis not present

## 2021-10-18 LAB — POCT URINALYSIS DIPSTICK
Bilirubin, UA: NEGATIVE
Blood, UA: POSITIVE
Glucose, UA: NEGATIVE
Ketones, UA: NEGATIVE
Nitrite, UA: POSITIVE
Protein, UA: POSITIVE — AB
Spec Grav, UA: 1.025 (ref 1.010–1.025)
Urobilinogen, UA: 0.2 E.U./dL
pH, UA: 5 (ref 5.0–8.0)

## 2021-10-18 MED ORDER — SULFAMETHOXAZOLE-TRIMETHOPRIM 800-160 MG PO TABS: 1.0000 | ORAL_TABLET | Freq: Two times a day (BID) | ORAL | 0 refills | Status: DC

## 2021-10-18 NOTE — Progress Notes (Signed)
Acute Office Visit  Subjective:     Patient ID: [name] Dodson, female    DOB: 1945/07/28, 76 y.o.   MRN: [id number]  Chief Complaint  Patient presents with   Urinary Tract Infection    Pt c/o urinary frequency, bladder pressure, odor, burning sensation/pain when urinating. Pt denies blood in urine, no flank pain    HPI Patient is in today for urinary frequency, dysuria, odor for the past 10 days.  URINARY SYMPTOMS  Dysuria: burning Urinary frequency: yes Urgency: yes Small volume voids: yes Urinary incontinence: yes Foul odor: yes Hematuria: no Abdominal pain: no Back pain: no Suprapubic pain/pressure: yes Flank pain: no Fever:  no Vomiting: no Relief with cranberry juice: no Relief with pyridium:  n/a Status: worse Previous urinary tract infection: yes Recurrent urinary tract infection: no Treatments attempted: cranberry    ROS See pertinent positives and negatives per HPI.     Objective:    BP 100/72   Pulse 72   Temp (!) 97.4 F (36.3 C) (Temporal)   Wt 208 lb 3.2 oz (94.4 kg)   SpO2 95%   BMI 31.66 kg/m    Physical Exam Vitals and nursing note reviewed.  Constitutional:      General: She is not in acute distress.    Appearance: Normal appearance.  HENT:     Head: Normocephalic.  Eyes:     Conjunctiva/sclera: Conjunctivae normal.  Cardiovascular:     Rate and Rhythm: Normal rate and regular rhythm.     Pulses: Normal pulses.     Heart sounds: Normal heart sounds.  Pulmonary:     Effort: Pulmonary effort is normal.     Breath sounds: Normal breath sounds.  Abdominal:     Palpations: Abdomen is soft.     Tenderness: There is no abdominal tenderness. There is no right CVA tenderness or left CVA tenderness.  Musculoskeletal:     Cervical back: Normal range of motion.  Skin:    General: Skin is warm.  Neurological:     General: No focal deficit present.     Mental Status: She is alert and oriented to person, place, and time.  Psychiatric:         Mood and Affect: Mood normal.        Behavior: Behavior normal.        Thought Content: Thought content normal.        Judgment: Judgment normal.     Results for orders placed or performed in visit on 10/18/21  POCT Urinalysis Dipstick  Result Value Ref Range   Color, UA dark yellow    Clarity, UA cloudy    Glucose, UA Negative Negative   Bilirubin, UA neg    Ketones, UA neg    Spec Grav, UA 1.025 1.010 - 1.025   Blood, UA pos    pH, UA 5.0 5.0 - 8.0   Protein, UA Positive (A) Negative   Urobilinogen, UA 0.2 0.2 or 1.0 E.U./dL   Nitrite, UA pos    Leukocytes, UA Large (3+) (A) Negative   Appearance     Odor yes         Assessment & Plan:   Problem List Items Addressed This Visit   None Visit Diagnoses     Acute cystitis with hematuria    -  Primary   Start Bactrim 1 tablet twice daily x7 days.  Encourage fluids.  We will add on urine culture.  Follow-up if symptoms do not improve or  worsen.   Relevant Orders   Urine Culture   Dysuria       Urine positive for 3+ leukocytes, blood, nitrites, and protein.  Will treat for UTI.   Relevant Orders   POCT Urinalysis Dipstick (Completed)       Meds ordered this encounter  Medications   sulfamethoxazole-trimethoprim (BACTRIM DS) 800-160 MG tablet    Sig: Take 1 tablet by mouth 2 (two) times daily.    Dispense:  14 tablet    Refill:  0    Return if symptoms worsen or fail to improve.  Gerre Scull, NP

## 2021-10-18 NOTE — Patient Instructions (Signed)
It was great to see you!  Start antibiotic (bactrim) 1 tablet twice a day for 7 days. Make sure you drink plenty of fluids.   Let's follow-up if your symptoms worsen or don't improve.  Take care,  Rodman Pickle, NP

## 2021-10-20 LAB — URINE CULTURE
MICRO NUMBER:: 13923950
SPECIMEN QUALITY:: ADEQUATE

## 2021-10-22 ENCOUNTER — Telehealth: Payer: Self-pay | Admitting: Medical

## 2021-10-22 NOTE — Telephone Encounter (Signed)
Pts daughter, [name], called requesting TOC to Vance Peper, DNP at [facility] due to proximity to pts home & daughter brings her to appointments.   Please advise.

## 2021-10-22 NOTE — Telephone Encounter (Signed)
Thank you Percell Miller.  Lauren - is there a recommended time frame you would like to see patient?

## 2021-10-23 NOTE — Telephone Encounter (Signed)
Scheduled for 10/4

## 2021-11-05 NOTE — Progress Notes (Unsigned)
New Patient Visit  There were no vitals taken for this visit.   Subjective:    Patient ID: [name] Dodson, female    DOB: 26-Sep-1945, 76 y.o.   MRN: [id number]  CC: No chief complaint on file.   HPI: [name] Dodson is a 76 y.o. female presents to transfer care to a new provider.  Introduced to Designer, jewellery role and practice setting.  All questions answered.  Discussed provider/patient relationship and expectations.   Past Medical History:  Diagnosis Date   Arthritis    Atrial fibrillation ([place])    afib 04/08/08 s/p DCCV 05/05/09, s/p CTI ablation 10/02/09; recurrent afib 2016 s/p DCCV 10/27/14. On flecainide, ASA. (Dr. Aggie Cosier)   Chronic back pain    stenosis   Dry skin    Dyspnea    Dysrhythmia    a fib   GERD (gastroesophageal reflux disease)    Tums daily   History of blood transfusion    no abnormal reaction   History of colon polyps    benign   Joint pain    Nocturia    Pneumonia    hx of   PONV (postoperative nausea and vomiting)    Weakness    numbness and tingling in right leg/foot    Past Surgical History:  Procedure Laterality Date   cataract surgery Bilateral    CHOLECYSTECTOMY     COLONOSCOPY     ESOPHAGOGASTRODUODENOSCOPY     heart ablation     HIP SURGERY Right    with plates/screws-partial replacement   LUMBAR LAMINECTOMY/DECOMPRESSION MICRODISCECTOMY N/A 02/14/2016   Procedure: LUMBAR 3-4 DECOMPRESSION;  Surgeon: Phylliss Bob, MD;  Location: [facility];  Service: Orthopedics;  Laterality: N/A;  LUMBAR 3-4 DECOMPRESSION     No family history on file.   Social History   Tobacco Use   Smoking status: Former   Smokeless tobacco: Never   Tobacco comments:    quit smoking 14+ yrs ago  Vaping Use   Vaping Use: Never used  Substance Use Topics   Alcohol use: Yes    Comment: rarely   Drug use: No    Current Outpatient Medications on File Prior to Visit  Medication Sig Dispense Refill   acetic acid 2 % otic solution Place 4 drops into both  ears every 3 (three) hours. 15 mL 0   alendronate (FOSAMAX) 10 MG tablet Take 1 tablet (10 mg total) by mouth daily before breakfast. Take with a full glass of water on an empty stomach. 30 tablet 11   cephALEXin (KEFLEX) 500 MG capsule Take 1 capsule (500 mg total) by mouth 2 (two) times daily. 20 capsule 0   clotrimazole (LOTRIMIN) 1 % external solution 1-2 drops twice a day to both ears 10 mL 0   flecainide (TAMBOCOR) 100 MG tablet Take 100 mg by mouth 2 (two) times daily.     neomycin-polymyxin-hydrocortisone (CORTISPORIN) OTIC solution Place 3 drops into both ears 4 (four) times daily. 10 mL 0   sulfamethoxazole-trimethoprim (BACTRIM DS) 800-160 MG tablet Take 1 tablet by mouth 2 (two) times daily. 14 tablet 0   No current facility-administered medications on file prior to visit.     Review of Systems      Objective:    There were no vitals taken for this visit.  Wt Readings from Last 3 Encounters:  10/18/21 208 lb 3.2 oz (94.4 kg)  12/28/18 204 lb 9.6 oz (92.8 kg)  11/10/18 209 lb 3.2 oz (94.9 kg)  BP Readings from Last 3 Encounters:  10/18/21 100/72  12/28/18 130/70  11/10/18 (!) 146/78    Physical Exam     Assessment & Plan:   Problem List Items Addressed This Visit   None    Follow up plan: No follow-ups on file.

## 2021-11-06 ENCOUNTER — Encounter: Payer: Self-pay | Admitting: Nurse Practitioner

## 2021-11-06 ENCOUNTER — Ambulatory Visit (INDEPENDENT_AMBULATORY_CARE_PROVIDER_SITE_OTHER): Payer: Medicare Other | Admitting: Nurse Practitioner

## 2021-11-06 VITALS — BP 114/78 | HR 62 | Temp 96.8°F | Ht 68.0 in | Wt 203.2 lb

## 2021-11-06 DIAGNOSIS — M1990 Unspecified osteoarthritis, unspecified site: Secondary | ICD-10-CM | POA: Diagnosis not present

## 2021-11-06 DIAGNOSIS — I4891 Unspecified atrial fibrillation: Secondary | ICD-10-CM | POA: Insufficient documentation

## 2021-11-06 DIAGNOSIS — M8580 Other specified disorders of bone density and structure, unspecified site: Secondary | ICD-10-CM | POA: Insufficient documentation

## 2021-11-06 DIAGNOSIS — K219 Gastro-esophageal reflux disease without esophagitis: Secondary | ICD-10-CM | POA: Diagnosis not present

## 2021-11-06 DIAGNOSIS — Z13220 Encounter for screening for lipoid disorders: Secondary | ICD-10-CM

## 2021-11-06 LAB — COMPREHENSIVE METABOLIC PANEL
ALT: 14 U/L (ref 0–35)
AST: 17 U/L (ref 0–37)
Albumin: 4 g/dL (ref 3.5–5.2)
Alkaline Phosphatase: 72 U/L (ref 39–117)
BUN: 27 mg/dL — ABNORMAL HIGH (ref 6–23)
CO2: 27 mEq/L (ref 19–32)
Calcium: 10.2 mg/dL (ref 8.4–10.5)
Chloride: 105 mEq/L (ref 96–112)
Creatinine, Ser: 0.98 mg/dL (ref 0.40–1.20)
GFR: 55.19 mL/min — ABNORMAL LOW (ref >60.00)
Glucose, Bld: 126 mg/dL — ABNORMAL HIGH (ref 70–99)
Potassium: 3.8 mEq/L (ref 3.5–5.1)
Sodium: 140 mEq/L (ref 135–145)
Total Bilirubin: 0.4 mg/dL (ref 0.2–1.2)
Total Protein: 6.7 g/dL (ref 6.0–8.3)

## 2021-11-06 LAB — LIPID PANEL
Cholesterol: 124 mg/dL (ref 0–200)
HDL: 44.3 mg/dL (ref >39.00)
LDL Cholesterol: 64 mg/dL (ref 0–99)
NonHDL: 79.95
Total CHOL/HDL Ratio: 3
Triglycerides: 80 mg/dL (ref 0.0–149.0)
VLDL: 16 mg/dL (ref 0.0–40.0)

## 2021-11-06 LAB — CBC
HCT: 41.4 % (ref 36.0–46.0)
Hemoglobin: 14.2 g/dL (ref 12.0–15.0)
MCHC: 34.4 g/dL (ref 30.0–36.0)
MCV: 103.5 fl — ABNORMAL HIGH (ref 78.0–100.0)
Platelets: 228 10*3/uL (ref 150.0–400.0)
RBC: 4 Mil/uL (ref 3.87–5.11)
RDW: 13.9 % (ref 11.5–15.5)
WBC: 8.5 10*3/uL (ref 4.0–10.5)

## 2021-11-06 NOTE — Assessment & Plan Note (Signed)
Chronic, stable. Continue tums as needed for pain. Reach out if symptoms worsen or she would like prescription medication to help with her symptoms.

## 2021-11-06 NOTE — Assessment & Plan Note (Signed)
Last T score -2.3 on 11/16/18. Recommend walking and making sure she gets plenty of calcium through her diet or start a supplement.

## 2021-11-06 NOTE — Patient Instructions (Signed)
It was great to see you!  We are checking your labs today and will let you know the results via mychart/phone.   Let's follow-up in 1 year, sooner if you have concerns.  If a referral was placed today, you will be contacted for an appointment. Please note that routine referrals can sometimes take up to 3-4 weeks to process. Please call our office if you haven't heard anything after this time frame.  Take care,  Ishi Danser, NP  

## 2021-11-06 NOTE — Assessment & Plan Note (Signed)
Chronic, stable. Continue collaboration and recommendations from Dr. Uvaldo Rising with cardiology. Continue flecainide 100mg  BID. Check CMP, CBC.

## 2021-11-06 NOTE — Assessment & Plan Note (Signed)
Chronic, stable. She has multiple areas of arthritis in her spine. Continue ibuprofen as needed for pain. Follow-up in 1 year or sooner with concerns.

## 2022-01-22 ENCOUNTER — Encounter: Payer: Self-pay | Admitting: Nurse Practitioner

## 2022-01-22 MED ORDER — OMEPRAZOLE 20 MG PO CPDR: 20.0000 mg | DELAYED_RELEASE_CAPSULE | Freq: Every day | ORAL | 3 refills | Status: DC

## 2022-02-04 ENCOUNTER — Encounter: Payer: Self-pay | Admitting: Nurse Practitioner

## 2022-02-10 ENCOUNTER — Encounter: Payer: Self-pay | Admitting: Nurse Practitioner

## 2022-02-11 NOTE — Progress Notes (Signed)
   Acute Office Visit  Subjective:     Patient ID: [name] Dodson, female    DOB: 12/12/1945, 77 y.o.   MRN: [id number]  Chief Complaint  Patient presents with   Acute Visit    Coughing x 3 weeks   coughing with mucus ,  no bodyaches or chills or fever, some nasal congestion expose to rsv,  taking otc medication     HPI Patient is in today for coughing and nasal congestion for 3 weeks. She states that she was exposed to RSV a few weeks ago when she first got sick. She denies shortness of breath, fevers, ear pain, and wheezing. She has been very fatigued and been sleeping a lot. She has been taking mucinex DM over the counter.   She has also been having some more confusion and forgetfulness. She has been staying with her daughter who has been helping her with medications and ADLs. She is not interested in seeing neurology or pursuing any cognitive testing.   ROS See pertinent positives and negatives per HPI.     Objective:    BP 126/74   Pulse 67   Temp (!) 96.2 F (35.7 C)   Ht 5\' 8"  (1.727 m)   Wt 196 lb (88.9 kg)   SpO2 94%   BMI 29.80 kg/m    Physical Exam Vitals and nursing note reviewed.  Constitutional:      General: She is not in acute distress.    Appearance: Normal appearance.  HENT:     Head: Normocephalic.     Right Ear: Tympanic membrane, ear canal and external ear normal.     Left Ear: Tympanic membrane, ear canal and external ear normal.  Eyes:     Conjunctiva/sclera: Conjunctivae normal.  Cardiovascular:     Rate and Rhythm: Normal rate and regular rhythm.     Pulses: Normal pulses.     Heart sounds: Normal heart sounds.  Pulmonary:     Effort: Pulmonary effort is normal.     Breath sounds: Normal breath sounds.  Musculoskeletal:     Cervical back: Normal range of motion.  Skin:    General: Skin is warm.  Neurological:     Mental Status: She is alert and oriented to person, place, and time.     Comments: Forgetful, asking daughter to answer some  questions   Psychiatric:        Mood and Affect: Mood normal.        Behavior: Behavior normal.        Thought Content: Thought content normal.        Judgment: Judgment normal.     No results found for any visits on 02/12/22.      Assessment & Plan:   Problem List Items Addressed This Visit   None Visit Diagnoses     Bronchitis       Will have her start doxycycline 100mg  BIDx10 days. Encourage fluids, rest. Continue mucinex BID. F/U if not improving.       Meds ordered this encounter  Medications   doxycycline (VIBRA-TABS) 100 MG tablet    Sig: Take 1 tablet (100 mg total) by mouth 2 (two) times daily.    Dispense:  20 tablet    Refill:  0    Return in about 3 months (around 05/14/2022) for a-fib, memory.  [name], NP

## 2022-02-12 ENCOUNTER — Ambulatory Visit (INDEPENDENT_AMBULATORY_CARE_PROVIDER_SITE_OTHER): Payer: Medicare Other | Admitting: Nurse Practitioner

## 2022-02-12 ENCOUNTER — Encounter: Payer: Self-pay | Admitting: Nurse Practitioner

## 2022-02-12 DIAGNOSIS — J40 Bronchitis, not specified as acute or chronic: Secondary | ICD-10-CM

## 2022-02-12 DIAGNOSIS — R8290 Unspecified abnormal findings in urine: Secondary | ICD-10-CM

## 2022-02-12 MED ORDER — DOXYCYCLINE HYCLATE 100 MG PO TABS: 100.0000 mg | ORAL_TABLET | Freq: Two times a day (BID) | ORAL | 0 refills | Status: DC

## 2022-02-12 NOTE — Patient Instructions (Signed)
It was great to see you!  Start taking doxycycline twice a day for 10 days.   Keep taking the mucinex twice a day.   Keep drinking plenty of fluids.   Let's follow-up in 3 months, sooner if you have concerns.  If a referral was placed today, you will be contacted for an appointment. Please note that routine referrals can sometimes take up to 3-4 weeks to process. Please call our office if you haven't heard anything after this time frame.  Take care,  Vance Peper, NP

## 2022-02-13 ENCOUNTER — Encounter: Payer: Self-pay | Admitting: Nurse Practitioner

## 2022-02-14 MED ORDER — AZITHROMYCIN 250 MG PO TABS: ORAL_TABLET | ORAL | 0 refills | Status: DC

## 2022-02-14 MED ORDER — ONDANSETRON 4 MG PO TBDP: 4.0000 mg | ORAL_TABLET | Freq: Three times a day (TID) | ORAL | 0 refills | Status: DC | PRN

## 2022-02-14 MED ORDER — BENZONATATE 100 MG PO CAPS: 100.0000 mg | ORAL_CAPSULE | Freq: Three times a day (TID) | ORAL | 0 refills | Status: DC | PRN

## 2022-02-18 ENCOUNTER — Encounter: Payer: Self-pay | Admitting: Nurse Practitioner

## 2022-03-18 ENCOUNTER — Telehealth: Payer: Self-pay | Admitting: Nurse Practitioner

## 2022-03-18 NOTE — Telephone Encounter (Signed)
Left message for patient to call back and schedule Medicare Annual Wellness Visit (AWV) either virtually or in office. Left  my [name] Dodson number [phone]   DUE 02/03/2009 AWVI PER [facility]- please schedule with Nurse Health Adviser  30 min for all AWV appointments

## 2022-03-24 ENCOUNTER — Inpatient Hospital Stay (HOSPITAL_COMMUNITY): Payer: Medicare Other

## 2022-03-24 ENCOUNTER — Inpatient Hospital Stay (HOSPITAL_COMMUNITY): Payer: Medicare Other | Admitting: Anesthesiology

## 2022-03-24 ENCOUNTER — Encounter (HOSPITAL_COMMUNITY): Payer: Self-pay | Admitting: Anesthesiology

## 2022-03-24 ENCOUNTER — Emergency Department (HOSPITAL_COMMUNITY): Payer: Medicare Other

## 2022-03-24 ENCOUNTER — Other Ambulatory Visit: Payer: Self-pay

## 2022-03-24 ENCOUNTER — Encounter (HOSPITAL_COMMUNITY)
Admission: EM | Disposition: A | Discharge status: Skilled Nursing Facility | Payer: Self-pay | Source: Home / Self Care | Attending: Internal Medicine

## 2022-03-24 ENCOUNTER — Inpatient Hospital Stay (HOSPITAL_COMMUNITY)
Admission: EM | Admit: 2022-03-24 | Discharge: 2022-03-31 | DRG: 522 | Disposition: A | Discharge status: Skilled Nursing Facility | Payer: Medicare Other | Attending: Internal Medicine | Admitting: Internal Medicine

## 2022-03-24 ENCOUNTER — Encounter (HOSPITAL_COMMUNITY): Payer: Self-pay | Admitting: *Deleted

## 2022-03-24 DIAGNOSIS — Z79899 Other long term (current) drug therapy: Secondary | ICD-10-CM | POA: Diagnosis not present

## 2022-03-24 DIAGNOSIS — Z96649 Presence of unspecified artificial hip joint: Secondary | ICD-10-CM | POA: Diagnosis not present

## 2022-03-24 DIAGNOSIS — Z87891 Personal history of nicotine dependence: Secondary | ICD-10-CM

## 2022-03-24 DIAGNOSIS — K5900 Constipation, unspecified: Secondary | ICD-10-CM | POA: Diagnosis present

## 2022-03-24 DIAGNOSIS — G8929 Other chronic pain: Secondary | ICD-10-CM | POA: Diagnosis present

## 2022-03-24 DIAGNOSIS — K219 Gastro-esophageal reflux disease without esophagitis: Secondary | ICD-10-CM | POA: Diagnosis present

## 2022-03-24 DIAGNOSIS — S72002A Fracture of unspecified part of neck of left femur, initial encounter for closed fracture: Secondary | ICD-10-CM | POA: Diagnosis present

## 2022-03-24 DIAGNOSIS — Z825 Family history of asthma and other chronic lower respiratory diseases: Secondary | ICD-10-CM

## 2022-03-24 DIAGNOSIS — D751 Secondary polycythemia: Secondary | ICD-10-CM | POA: Diagnosis present

## 2022-03-24 DIAGNOSIS — R739 Hyperglycemia, unspecified: Secondary | ICD-10-CM | POA: Diagnosis present

## 2022-03-24 DIAGNOSIS — I480 Paroxysmal atrial fibrillation: Secondary | ICD-10-CM | POA: Diagnosis present

## 2022-03-24 DIAGNOSIS — M549 Dorsalgia, unspecified: Secondary | ICD-10-CM | POA: Diagnosis present

## 2022-03-24 DIAGNOSIS — I4891 Unspecified atrial fibrillation: Secondary | ICD-10-CM | POA: Diagnosis not present

## 2022-03-24 DIAGNOSIS — D500 Iron deficiency anemia secondary to blood loss (chronic): Secondary | ICD-10-CM | POA: Diagnosis present

## 2022-03-24 DIAGNOSIS — W010XXA Fall on same level from slipping, tripping and stumbling without subsequent striking against object, initial encounter: Secondary | ICD-10-CM | POA: Diagnosis present

## 2022-03-24 DIAGNOSIS — Z8249 Family history of ischemic heart disease and other diseases of the circulatory system: Secondary | ICD-10-CM | POA: Diagnosis not present

## 2022-03-24 DIAGNOSIS — Z888 Allergy status to other drugs, medicaments and biological substances status: Secondary | ICD-10-CM

## 2022-03-24 DIAGNOSIS — G9389 Other specified disorders of brain: Secondary | ICD-10-CM | POA: Diagnosis present

## 2022-03-24 DIAGNOSIS — D72829 Elevated white blood cell count, unspecified: Secondary | ICD-10-CM | POA: Diagnosis present

## 2022-03-24 DIAGNOSIS — M810 Age-related osteoporosis without current pathological fracture: Secondary | ICD-10-CM | POA: Diagnosis present

## 2022-03-24 DIAGNOSIS — M545 Low back pain, unspecified: Secondary | ICD-10-CM | POA: Diagnosis not present

## 2022-03-24 HISTORY — PX: ANTERIOR APPROACH HEMI HIP ARTHROPLASTY: SHX6690

## 2022-03-24 LAB — GLUCOSE, CAPILLARY: Glucose-Capillary: 180 mg/dL — ABNORMAL HIGH (ref 70–99)

## 2022-03-24 LAB — BASIC METABOLIC PANEL
Anion gap: 15 (ref 5–15)
BUN: 12 mg/dL (ref 8–23)
CO2: 22 mmol/L (ref 22–32)
Calcium: 9.7 mg/dL (ref 8.9–10.3)
Chloride: 100 mmol/L (ref 98–111)
Creatinine, Ser: 0.7 mg/dL (ref 0.44–1.00)
GFR, Estimated: >60 mL/min (ref >60)
Glucose, Bld: 139 mg/dL — ABNORMAL HIGH (ref 70–99)
Potassium: 4 mmol/L (ref 3.5–5.1)
Sodium: 137 mmol/L (ref 135–145)

## 2022-03-24 LAB — CBC WITH DIFFERENTIAL/PLATELET
Abs Immature Granulocytes: 0.13 10*3/uL — ABNORMAL HIGH (ref 0.00–0.07)
Basophils Absolute: 0 10*3/uL (ref 0.0–0.1)
Basophils Relative: 0 %
Eosinophils Absolute: 0 10*3/uL (ref 0.0–0.5)
Eosinophils Relative: 0 %
HCT: 41.4 % (ref 36.0–46.0)
Hemoglobin: 15.1 g/dL — ABNORMAL HIGH (ref 12.0–15.0)
Immature Granulocytes: 1 %
Lymphocytes Relative: 5 %
Lymphs Abs: 0.9 10*3/uL (ref 0.7–4.0)
MCH: 36 pg — ABNORMAL HIGH (ref 26.0–34.0)
MCHC: 36.5 g/dL — ABNORMAL HIGH (ref 30.0–36.0)
MCV: 98.6 fL (ref 80.0–100.0)
Monocytes Absolute: 1 10*3/uL (ref 0.1–1.0)
Monocytes Relative: 5 %
Neutro Abs: 17.6 10*3/uL — ABNORMAL HIGH (ref 1.7–7.7)
Neutrophils Relative %: 89 %
Platelets: 209 10*3/uL (ref 150–400)
RBC: 4.2 MIL/uL (ref 3.87–5.11)
RDW: 12.9 % (ref 11.5–15.5)
WBC: 19.6 10*3/uL — ABNORMAL HIGH (ref 4.0–10.5)
nRBC: 0 % (ref 0.0–0.2)

## 2022-03-24 LAB — SURGICAL PCR SCREEN
MRSA, PCR: NEGATIVE
Staphylococcus aureus: NEGATIVE

## 2022-03-24 SURGERY — HEMIARTHROPLASTY, HIP, DIRECT ANTERIOR APPROACH, FOR FRACTURE
Anesthesia: General | Site: Hip | Laterality: Left

## 2022-03-24 MED ORDER — ALBUMIN HUMAN 5 % IV SOLN: INTRAVENOUS | Status: DC | PRN

## 2022-03-24 MED ORDER — DOCUSATE SODIUM 100 MG PO CAPS
100.0000 mg | ORAL_CAPSULE | Freq: Two times a day (BID) | ORAL | Status: DC
  Administered 2022-03-24 – 2022-03-31 (×13): 100 mg via ORAL
  Filled 2022-03-24 (×14): qty 1

## 2022-03-24 MED ORDER — AMISULPRIDE (ANTIEMETIC) 5 MG/2ML IV SOLN: 10.0000 mg | Freq: Once | INTRAVENOUS | Status: DC | PRN

## 2022-03-24 MED ORDER — METOCLOPRAMIDE HCL 5 MG/ML IJ SOLN: 5.0000 mg | Freq: Three times a day (TID) | INTRAMUSCULAR | Status: DC | PRN

## 2022-03-24 MED ORDER — EPINEPHRINE PF 1 MG/ML IJ SOLN
INTRAMUSCULAR | Status: AC
  Filled 2022-03-24: qty 1

## 2022-03-24 MED ORDER — HYDROMORPHONE HCL 1 MG/ML IJ SOLN: 0.5000 mg | INTRAMUSCULAR | Status: DC | PRN

## 2022-03-24 MED ORDER — ASPIRIN 325 MG PO TBEC
325.0000 mg | DELAYED_RELEASE_TABLET | Freq: Two times a day (BID) | ORAL | Status: DC
  Administered 2022-03-24 – 2022-03-31 (×14): 325 mg via ORAL
  Filled 2022-03-24 (×13): qty 1

## 2022-03-24 MED ORDER — ONDANSETRON HCL 4 MG/2ML IJ SOLN
4.0000 mg | Freq: Four times a day (QID) | INTRAMUSCULAR | Status: DC | PRN
  Administered 2022-03-24 (×2): 4 mg via INTRAVENOUS
  Filled 2022-03-24: qty 2

## 2022-03-24 MED ORDER — PHENYLEPHRINE HCL-NACL 20-0.9 MG/250ML-% IV SOLN
INTRAVENOUS | Status: DC | PRN
  Administered 2022-03-24: 40 ug/min via INTRAVENOUS

## 2022-03-24 MED ORDER — BUPIVACAINE HCL (PF) 0.25 % IJ SOLN
INTRAMUSCULAR | Status: AC
  Filled 2022-03-24: qty 30

## 2022-03-24 MED ORDER — ONDANSETRON HCL 4 MG/2ML IJ SOLN
INTRAMUSCULAR | Status: AC
  Filled 2022-03-24: qty 2

## 2022-03-24 MED ORDER — ONDANSETRON HCL 4 MG PO TABS: 4.0000 mg | ORAL_TABLET | Freq: Four times a day (QID) | ORAL | Status: DC | PRN

## 2022-03-24 MED ORDER — LACTATED RINGERS IV SOLN: INTRAVENOUS | Status: DC

## 2022-03-24 MED ORDER — ONDANSETRON HCL 4 MG/2ML IJ SOLN
4.0000 mg | Freq: Once | INTRAMUSCULAR | Status: AC
  Administered 2022-03-24: 4 mg via INTRAVENOUS
  Filled 2022-03-24: qty 2

## 2022-03-24 MED ORDER — MAGNESIUM CITRATE PO SOLN: 1.0000 | Freq: Once | ORAL | Status: DC | PRN

## 2022-03-24 MED ORDER — PROPOFOL 10 MG/ML IV BOLUS
INTRAVENOUS | Status: AC
  Filled 2022-03-24: qty 20

## 2022-03-24 MED ORDER — SUGAMMADEX SODIUM 200 MG/2ML IV SOLN
INTRAVENOUS | Status: DC | PRN
  Administered 2022-03-24: 200 mg via INTRAVENOUS

## 2022-03-24 MED ORDER — ACETAMINOPHEN 10 MG/ML IV SOLN
1000.0000 mg | Freq: Once | INTRAVENOUS | Status: AC
  Administered 2022-03-24: 1000 mg via INTRAVENOUS
  Filled 2022-03-24: qty 100

## 2022-03-24 MED ORDER — OXYCODONE HCL 5 MG PO TABS: 5.0000 mg | ORAL_TABLET | Freq: Once | ORAL | Status: DC | PRN

## 2022-03-24 MED ORDER — PANTOPRAZOLE SODIUM 40 MG PO TBEC: 40.0000 mg | DELAYED_RELEASE_TABLET | Freq: Every day | ORAL | Status: DC

## 2022-03-24 MED ORDER — MORPHINE SULFATE (PF) 4 MG/ML IV SOLN
4.0000 mg | Freq: Once | INTRAVENOUS | Status: AC
  Administered 2022-03-24: 4 mg via INTRAVENOUS
  Filled 2022-03-24: qty 1

## 2022-03-24 MED ORDER — BUPIVACAINE LIPOSOME 1.3 % IJ SUSP
INTRAMUSCULAR | Status: AC
  Filled 2022-03-24: qty 10

## 2022-03-24 MED ORDER — ACETAMINOPHEN 500 MG PO TABS
1000.0000 mg | ORAL_TABLET | Freq: Four times a day (QID) | ORAL | Status: AC
  Administered 2022-03-24 – 2022-03-25 (×3): 1000 mg via ORAL
  Filled 2022-03-24 (×3): qty 2

## 2022-03-24 MED ORDER — OXYCODONE HCL 5 MG PO TABS: 10.0000 mg | ORAL_TABLET | ORAL | Status: DC | PRN

## 2022-03-24 MED ORDER — TRANEXAMIC ACID-NACL 1000-0.7 MG/100ML-% IV SOLN
INTRAVENOUS | Status: AC
  Filled 2022-03-24: qty 100

## 2022-03-24 MED ORDER — CALCIUM CARBONATE ANTACID 500 MG PO CHEW
1.0000 | CHEWABLE_TABLET | Freq: Every day | ORAL | Status: DC | PRN
  Administered 2022-03-28: 200 mg via ORAL
  Filled 2022-03-24: qty 1

## 2022-03-24 MED ORDER — ACETAMINOPHEN 325 MG PO TABS: 650.0000 mg | ORAL_TABLET | Freq: Four times a day (QID) | ORAL | Status: DC | PRN

## 2022-03-24 MED ORDER — ALBUMIN HUMAN 5 % IV SOLN
INTRAVENOUS | Status: AC
  Filled 2022-03-24: qty 250

## 2022-03-24 MED ORDER — FENTANYL CITRATE PF 50 MCG/ML IJ SOSY: 25.0000 ug | PREFILLED_SYRINGE | INTRAMUSCULAR | Status: DC | PRN

## 2022-03-24 MED ORDER — DEXTROSE-NACL 5-0.45 % IV SOLN: INTRAVENOUS | Status: DC

## 2022-03-24 MED ORDER — MORPHINE SULFATE (PF) 2 MG/ML IV SOLN: 2.0000 mg | INTRAVENOUS | Status: DC | PRN

## 2022-03-24 MED ORDER — PHENYLEPHRINE 80 MCG/ML (10ML) SYRINGE FOR IV PUSH (FOR BLOOD PRESSURE SUPPORT)
PREFILLED_SYRINGE | INTRAVENOUS | Status: AC
  Filled 2022-03-24: qty 10

## 2022-03-24 MED ORDER — ZOLPIDEM TARTRATE 5 MG PO TABS
5.0000 mg | ORAL_TABLET | Freq: Every evening | ORAL | Status: DC | PRN
  Administered 2022-03-28 – 2022-03-30 (×3): 5 mg via ORAL
  Filled 2022-03-24 (×3): qty 1

## 2022-03-24 MED ORDER — EPHEDRINE SULFATE (PRESSORS) 50 MG/ML IJ SOLN
INTRAMUSCULAR | Status: DC | PRN
  Administered 2022-03-24: 5 mg via INTRAVENOUS

## 2022-03-24 MED ORDER — DEXAMETHASONE SODIUM PHOSPHATE 10 MG/ML IJ SOLN
INTRAMUSCULAR | Status: AC
  Filled 2022-03-24: qty 1

## 2022-03-24 MED ORDER — TRANEXAMIC ACID-NACL 1000-0.7 MG/100ML-% IV SOLN
1000.0000 mg | Freq: Once | INTRAVENOUS | Status: AC
  Administered 2022-03-24: 1000 mg via INTRAVENOUS
  Filled 2022-03-24: qty 100

## 2022-03-24 MED ORDER — OXYCODONE HCL 5 MG/5ML PO SOLN: 5.0000 mg | Freq: Once | ORAL | Status: DC | PRN

## 2022-03-24 MED ORDER — 0.9 % SODIUM CHLORIDE (POUR BTL) OPTIME
TOPICAL | Status: DC | PRN
  Administered 2022-03-24: 1000 mL

## 2022-03-24 MED ORDER — FENTANYL CITRATE PF 50 MCG/ML IJ SOSY
PREFILLED_SYRINGE | INTRAMUSCULAR | Status: AC
  Filled 2022-03-24: qty 2

## 2022-03-24 MED ORDER — LIDOCAINE HCL (PF) 2 % IJ SOLN
INTRAMUSCULAR | Status: AC
  Filled 2022-03-24: qty 5

## 2022-03-24 MED ORDER — PROPOFOL 1000 MG/100ML IV EMUL
INTRAVENOUS | Status: AC
  Filled 2022-03-24: qty 100

## 2022-03-24 MED ORDER — CEFAZOLIN SODIUM-DEXTROSE 2-4 GM/100ML-% IV SOLN
2.0000 g | Freq: Four times a day (QID) | INTRAVENOUS | Status: AC
  Administered 2022-03-24 – 2022-03-25 (×2): 2 g via INTRAVENOUS
  Filled 2022-03-24 (×2): qty 100

## 2022-03-24 MED ORDER — ACETAMINOPHEN 500 MG PO TABS
1000.0000 mg | ORAL_TABLET | Freq: Once | ORAL | Status: AC
  Administered 2022-03-24: 1000 mg via ORAL
  Filled 2022-03-24: qty 2

## 2022-03-24 MED ORDER — BISACODYL 10 MG RE SUPP
10.0000 mg | Freq: Every day | RECTAL | Status: DC | PRN
  Administered 2022-03-30: 10 mg via RECTAL
  Filled 2022-03-24: qty 1

## 2022-03-24 MED ORDER — CEFAZOLIN SODIUM-DEXTROSE 2-4 GM/100ML-% IV SOLN
2.0000 g | INTRAVENOUS | Status: AC
  Administered 2022-03-24: 2 g via INTRAVENOUS

## 2022-03-24 MED ORDER — ROCURONIUM BROMIDE 100 MG/10ML IV SOLN
INTRAVENOUS | Status: DC | PRN
  Administered 2022-03-24: 30 mg via INTRAVENOUS
  Administered 2022-03-24: 10 mg via INTRAVENOUS
  Administered 2022-03-24: 30 mg via INTRAVENOUS

## 2022-03-24 MED ORDER — MENTHOL 3 MG MT LOZG: 1.0000 | LOZENGE | OROMUCOSAL | Status: DC | PRN

## 2022-03-24 MED ORDER — EPHEDRINE 5 MG/ML INJ
INTRAVENOUS | Status: AC
  Filled 2022-03-24: qty 5

## 2022-03-24 MED ORDER — HYDROMORPHONE HCL 2 MG/ML IJ SOLN
INTRAMUSCULAR | Status: AC
  Filled 2022-03-24: qty 1

## 2022-03-24 MED ORDER — PHENOL 1.4 % MT LIQD: 1.0000 | OROMUCOSAL | Status: DC | PRN

## 2022-03-24 MED ORDER — BUPIVACAINE LIPOSOME 1.3 % IJ SUSP
INTRAMUSCULAR | Status: DC | PRN
  Administered 2022-03-24: 10 mL

## 2022-03-24 MED ORDER — CEFAZOLIN SODIUM-DEXTROSE 2-4 GM/100ML-% IV SOLN
INTRAVENOUS | Status: AC
  Filled 2022-03-24: qty 100

## 2022-03-24 MED ORDER — INSULIN ASPART 100 UNIT/ML IJ SOLN: 0.0000 [IU] | Freq: Every day | INTRAMUSCULAR | Status: DC

## 2022-03-24 MED ORDER — METHOCARBAMOL 500 MG PO TABS
500.0000 mg | ORAL_TABLET | Freq: Four times a day (QID) | ORAL | Status: DC | PRN
  Administered 2022-03-26 – 2022-03-30 (×3): 500 mg via ORAL
  Filled 2022-03-24 (×3): qty 1

## 2022-03-24 MED ORDER — ACETAMINOPHEN 325 MG PO TABS
325.0000 mg | ORAL_TABLET | Freq: Four times a day (QID) | ORAL | Status: DC | PRN
  Administered 2022-03-26 – 2022-03-29 (×2): 650 mg via ORAL
  Filled 2022-03-24 (×2): qty 2

## 2022-03-24 MED ORDER — PANTOPRAZOLE SODIUM 40 MG PO TBEC
40.0000 mg | DELAYED_RELEASE_TABLET | Freq: Every day | ORAL | Status: DC
  Administered 2022-03-25 – 2022-03-31 (×7): 40 mg via ORAL
  Filled 2022-03-24 (×7): qty 1

## 2022-03-24 MED ORDER — TRANEXAMIC ACID-NACL 1000-0.7 MG/100ML-% IV SOLN
1000.0000 mg | INTRAVENOUS | Status: AC
  Administered 2022-03-24: 1000 mg via INTRAVENOUS

## 2022-03-24 MED ORDER — METOCLOPRAMIDE HCL 10 MG PO TABS: 5.0000 mg | ORAL_TABLET | Freq: Three times a day (TID) | ORAL | Status: DC | PRN

## 2022-03-24 MED ORDER — LIDOCAINE HCL (CARDIAC) PF 100 MG/5ML IV SOSY
PREFILLED_SYRINGE | INTRAVENOUS | Status: DC | PRN
  Administered 2022-03-24: 80 mg via INTRAVENOUS

## 2022-03-24 MED ORDER — FLECAINIDE ACETATE 100 MG PO TABS
100.0000 mg | ORAL_TABLET | Freq: Two times a day (BID) | ORAL | Status: DC
  Administered 2022-03-24 – 2022-03-31 (×14): 100 mg via ORAL
  Filled 2022-03-24 (×15): qty 1

## 2022-03-24 MED ORDER — CHLORHEXIDINE GLUCONATE 4 % EX LIQD: 60.0000 mL | Freq: Once | CUTANEOUS | Status: DC

## 2022-03-24 MED ORDER — POVIDONE-IODINE 10 % EX SWAB
2.0000 | Freq: Once | CUTANEOUS | Status: AC
  Administered 2022-03-24: 2 via TOPICAL

## 2022-03-24 MED ORDER — INSULIN ASPART 100 UNIT/ML IJ SOLN
0.0000 [IU] | Freq: Three times a day (TID) | INTRAMUSCULAR | Status: DC
  Administered 2022-03-25: 2 [IU] via SUBCUTANEOUS
  Administered 2022-03-25: 3 [IU] via SUBCUTANEOUS
  Administered 2022-03-25: 5 [IU] via SUBCUTANEOUS
  Administered 2022-03-26 (×3): 2 [IU] via SUBCUTANEOUS
  Administered 2022-03-28: 3 [IU] via SUBCUTANEOUS
  Administered 2022-03-29 – 2022-03-30 (×4): 2 [IU] via SUBCUTANEOUS
  Administered 2022-03-31: 1 [IU] via SUBCUTANEOUS

## 2022-03-24 MED ORDER — METHOCARBAMOL 1000 MG/10ML IJ SOLN: 500.0000 mg | Freq: Four times a day (QID) | INTRAVENOUS | Status: DC | PRN

## 2022-03-24 MED ORDER — DEXAMETHASONE SODIUM PHOSPHATE 10 MG/ML IJ SOLN
INTRAMUSCULAR | Status: DC | PRN
  Administered 2022-03-24: 8 mg via INTRAVENOUS

## 2022-03-24 MED ORDER — ENSURE ENLIVE PO LIQD
237.0000 mL | Freq: Two times a day (BID) | ORAL | Status: DC
  Administered 2022-03-24 – 2022-03-31 (×9): 237 mL via ORAL

## 2022-03-24 MED ORDER — BUPIVACAINE-EPINEPHRINE 0.25% -1:200000 IJ SOLN
INTRAMUSCULAR | Status: DC | PRN
  Administered 2022-03-24: 30 mL

## 2022-03-24 MED ORDER — PHENYLEPHRINE HCL-NACL 20-0.9 MG/250ML-% IV SOLN
INTRAVENOUS | Status: AC
  Filled 2022-03-24: qty 250

## 2022-03-24 MED ORDER — PROPOFOL 10 MG/ML IV BOLUS
INTRAVENOUS | Status: DC | PRN
  Administered 2022-03-24: 130 mg via INTRAVENOUS

## 2022-03-24 MED ORDER — PHENYLEPHRINE HCL (PRESSORS) 10 MG/ML IV SOLN
INTRAVENOUS | Status: DC | PRN
  Administered 2022-03-24: 80 ug via INTRAVENOUS
  Administered 2022-03-24: 160 ug via INTRAVENOUS

## 2022-03-24 MED ORDER — HYDROMORPHONE HCL 1 MG/ML IJ SOLN
INTRAMUSCULAR | Status: DC | PRN
  Administered 2022-03-24: .5 mg via INTRAVENOUS

## 2022-03-24 MED ORDER — OXYCODONE HCL 5 MG PO TABS
5.0000 mg | ORAL_TABLET | ORAL | Status: DC | PRN
  Administered 2022-03-25 – 2022-03-26 (×4): 5 mg via ORAL
  Administered 2022-03-27: 10 mg via ORAL
  Administered 2022-03-27 – 2022-03-31 (×7): 5 mg via ORAL
  Filled 2022-03-24 (×10): qty 1
  Filled 2022-03-24: qty 2
  Filled 2022-03-24 (×3): qty 1

## 2022-03-24 MED ORDER — PROPOFOL 500 MG/50ML IV EMUL
INTRAVENOUS | Status: DC | PRN
  Administered 2022-03-24: 150 ug/kg/min via INTRAVENOUS

## 2022-03-24 MED ORDER — MIDAZOLAM HCL 2 MG/2ML IJ SOLN
INTRAMUSCULAR | Status: AC
  Filled 2022-03-24: qty 2

## 2022-03-24 MED ORDER — SUCCINYLCHOLINE CHLORIDE 200 MG/10ML IV SOSY
PREFILLED_SYRINGE | INTRAVENOUS | Status: DC | PRN
  Administered 2022-03-24: 100 mg via INTRAVENOUS

## 2022-03-24 MED ORDER — KETOROLAC TROMETHAMINE 15 MG/ML IJ SOLN: 15.0000 mg | Freq: Once | INTRAMUSCULAR | Status: AC

## 2022-03-24 MED ORDER — FENTANYL CITRATE (PF) 100 MCG/2ML IJ SOLN
INTRAMUSCULAR | Status: DC | PRN
  Administered 2022-03-24: 25 ug via INTRAVENOUS
  Administered 2022-03-24: 50 ug via INTRAVENOUS
  Administered 2022-03-24: 25 ug via INTRAVENOUS

## 2022-03-24 MED ORDER — FENTANYL CITRATE (PF) 100 MCG/2ML IJ SOLN
INTRAMUSCULAR | Status: AC
  Filled 2022-03-24: qty 2

## 2022-03-24 MED ORDER — PANTOPRAZOLE SODIUM 40 MG IV SOLR
40.0000 mg | Freq: Once | INTRAVENOUS | Status: AC
  Administered 2022-03-24: 40 mg via INTRAVENOUS
  Filled 2022-03-24: qty 10

## 2022-03-24 MED ORDER — FERROUS SULFATE 325 (65 FE) MG PO TABS
325.0000 mg | ORAL_TABLET | Freq: Two times a day (BID) | ORAL | Status: DC
  Administered 2022-03-25 – 2022-03-31 (×14): 325 mg via ORAL
  Filled 2022-03-24 (×14): qty 1

## 2022-03-24 MED ORDER — WATER FOR IRRIGATION, STERILE IR SOLN
Status: DC | PRN
  Administered 2022-03-24: 2000 mL

## 2022-03-24 MED ORDER — ACETAMINOPHEN 650 MG RE SUPP: 650.0000 mg | Freq: Four times a day (QID) | RECTAL | Status: DC | PRN

## 2022-03-24 MED ORDER — OXYCODONE HCL 5 MG PO TABS
5.0000 mg | ORAL_TABLET | Freq: Four times a day (QID) | ORAL | Status: DC | PRN
  Filled 2022-03-24: qty 1

## 2022-03-24 MED ORDER — SENNOSIDES-DOCUSATE SODIUM 8.6-50 MG PO TABS: 1.0000 | ORAL_TABLET | Freq: Every evening | ORAL | Status: DC | PRN

## 2022-03-24 MED ORDER — ONDANSETRON HCL 4 MG/2ML IJ SOLN: 4.0000 mg | Freq: Four times a day (QID) | INTRAMUSCULAR | Status: DC | PRN

## 2022-03-24 MED ORDER — ALUM & MAG HYDROXIDE-SIMETH 200-200-20 MG/5ML PO SUSP: 30.0000 mL | ORAL | Status: DC | PRN

## 2022-03-24 SURGICAL SUPPLY — 45 items
APL SKNCLS STERI-STRIP NONHPOA (GAUZE/BANDAGES/DRESSINGS)
BAG COUNTER SPONGE SURGICOUNT (BAG) IMPLANT
BAG SPEC THK2 15X12 ZIP CLS (MISCELLANEOUS)
BAG SPNG CNTER NS LX DISP (BAG)
BAG ZIPLOCK 12X15 (MISCELLANEOUS) IMPLANT
BENZOIN TINCTURE PRP APPL 2/3 (GAUZE/BANDAGES/DRESSINGS) IMPLANT
BIPOLAR DEPUY 49 (Hips) ×1 IMPLANT
BLADE SAW SGTL 18X1.27X75 (BLADE) ×1 IMPLANT
BLADE SURG SZ10 CARB STEEL (BLADE) ×2 IMPLANT
COVER PERINEAL POST (MISCELLANEOUS) ×1 IMPLANT
COVER SURGICAL LIGHT HANDLE (MISCELLANEOUS) ×1 IMPLANT
DRAPE FOOT SWITCH (DRAPES) ×1 IMPLANT
DRAPE STERI IOBAN 125X83 (DRAPES) ×1 IMPLANT
DRAPE U-SHAPE 47X51 STRL (DRAPES) ×2 IMPLANT
DRSG AQUACEL AG ADV 3.5X 6 (GAUZE/BANDAGES/DRESSINGS) ×1 IMPLANT
DURAPREP 26ML APPLICATOR (WOUND CARE) ×1 IMPLANT
ELECT REM PT RETURN 15FT ADLT (MISCELLANEOUS) ×1 IMPLANT
GAUZE XEROFORM 1X8 LF (GAUZE/BANDAGES/DRESSINGS) IMPLANT
GLOVE BIOGEL PI IND STRL 8 (GLOVE) ×2 IMPLANT
GLOVE ECLIPSE 7.5 STRL STRAW (GLOVE) ×2 IMPLANT
GOWN STRL REUS W/ TWL XL LVL3 (GOWN DISPOSABLE) ×2 IMPLANT
GOWN STRL REUS W/TWL XL LVL3 (GOWN DISPOSABLE) ×2
HEAD BIPOLAR DEPUY 49 (Hips) IMPLANT
HEAD FEM STD 28X+1.5 STRL (Hips) IMPLANT
HOLDER FOLEY CATH W/STRAP (MISCELLANEOUS) ×1 IMPLANT
HOOD PEEL AWAY T7 (MISCELLANEOUS) ×3 IMPLANT
KIT TURNOVER KIT A (KITS) IMPLANT
NDL HYPO 22X1.5 SAFETY MO (MISCELLANEOUS) ×1 IMPLANT
NEEDLE HYPO 22X1.5 SAFETY MO (MISCELLANEOUS) ×1 IMPLANT
NEEDLE SAFETY HYPO 22GAX1.5 (MISCELLANEOUS) ×1
PACK ANTERIOR HIP CUSTOM (KITS) ×1 IMPLANT
SPIKE FLUID TRANSFER (MISCELLANEOUS) ×1 IMPLANT
STAPLER VISISTAT 35W (STAPLE) IMPLANT
STEM FEMORAL SZ8 STD ACTIS (Stem) IMPLANT
STRIP CLOSURE SKIN 1/2X4 (GAUZE/BANDAGES/DRESSINGS) IMPLANT
SUT ETHIBOND NAB CT1 #1 30IN (SUTURE) ×2 IMPLANT
SUT MNCRL AB 3-0 PS2 18 (SUTURE) IMPLANT
SUT VIC AB 0 CT1 36 (SUTURE) ×1 IMPLANT
SUT VIC AB 1 CT1 36 (SUTURE) ×1 IMPLANT
SUT VIC AB 2-0 CT1 27 (SUTURE) ×1
SUT VIC AB 2-0 CT1 TAPERPNT 27 (SUTURE) ×1 IMPLANT
SUT VIC AB 3-0 CT1 27 (SUTURE)
SUT VIC AB 3-0 CT1 TAPERPNT 27 (SUTURE) IMPLANT
TRAY FOLEY MTR SLVR 16FR STAT (SET/KITS/TRAYS/PACK) ×1 IMPLANT
TUBE SUCTION HIGH CAP CLEAR NV (SUCTIONS) ×1 IMPLANT

## 2022-03-24 NOTE — H&P (Signed)
History and Physical    Patient: [name] Dodson DOB: 05/19/1945 DOA: 03/24/2022 DOS: the patient was seen and examined on 03/24/2022 PCP: [name], NP  Patient coming from: Home  Chief Complaint:  Chief Complaint  Patient presents with   Fall   HPI: [name] Dodson is a 77 y.o. female with medical history significant of osteoarthritis, unspecified atrial fibrillation, chronic back pain, dry skin, dyspnea, GERD, history of blood transfusion, colon polyps, nocturia, history of pneumonia, generalized weakness who had a mechanical fall this morning after getting up from bed to go to the bathroom.  The patient stated that after she got up to wash her hands she does not remember anything else happened and woke up on the floor.  She is unable to specify more about the HPI as she was recently medicated.  Patient's daughter stated that she has been mildly confused afterwards, but has also been experiencing some memory issues recently. She denied fever, chills, rhinorrhea, sore throat, wheezing or hemoptysis.  No chest pain, palpitations, diaphoresis, PND, orthopnea or pitting edema of the lower extremities.  No abdominal pain, nausea, emesis, diarrhea, constipation, melena or hematochezia.  No flank pain, dysuria, frequency or hematuria.  No polyuria, polydipsia, polyphagia or blurred vision.   Lab work: CBC showed a white count of 19.6 with 89% neutrophils, Monnin 15.1 g/dL platelets 209.  BMP showed a glucose of 139 mg/dL, but was otherwise unremarkable.  Imaging: Hip x-ray shows impacted left femoral neck fracture.  Portable 1 view chest radiograph with low volume chest with no acute finding.  CT head without contrast no evidence of intracranial injury.  There is ventriculomegaly with brain atrophy along with some features that could be seen with communicating hydrocephalus.  Please correlate for NPH symptoms.   ED course: Initial vital signs were temperature 98.8 F, pulse 107,  respirations 20, BP 151/109 mmHg O2 sat 94% on room air.  The patient received morphine 4 mg IVP x 2 and ondansetron 4 mg IVP x 1.  Review of Systems: As mentioned in the history of present illness. All other systems reviewed and are negative. Past Medical History:  Diagnosis Date   Arthritis    Atrial fibrillation ([place])    afib 04/08/08 s/p DCCV 05/05/09, s/p CTI ablation 10/02/09; recurrent afib 2016 s/p DCCV 10/27/14. On flecainide, ASA. (Dr. Aggie Cosier)   Chronic back pain    stenosis   Dry skin    Dyspnea    Dysrhythmia    a fib   GERD (gastroesophageal reflux disease)    Tums daily   History of blood transfusion    no abnormal reaction   History of colon polyps    benign   Joint pain    Nocturia    Pneumonia    hx of   PONV (postoperative nausea and vomiting)    Weakness    numbness and tingling in right leg/foot   Past Surgical History:  Procedure Laterality Date   cataract surgery Bilateral    CHOLECYSTECTOMY     COLONOSCOPY     ESOPHAGOGASTRODUODENOSCOPY     heart ablation     HIP SURGERY Right    with plates/screws-partial replacement   LUMBAR LAMINECTOMY/DECOMPRESSION MICRODISCECTOMY N/A 02/14/2016   Procedure: LUMBAR 3-4 DECOMPRESSION;  Surgeon: Phylliss Bob, MD;  Location: [facility];  Service: Orthopedics;  Laterality: N/A;  LUMBAR 3-4 DECOMPRESSION    Social History:  reports that she has quit smoking. She has never used smokeless tobacco. She reports  current alcohol use. She reports that she does not use drugs.  Allergies  Allergen Reactions   Chlorhexidine Rash    Red rash and burning    Family History  Problem Relation Age of Onset   Hypertension Mother    COPD Father     Prior to Admission medications   Medication Sig Start Date End Date Taking? Authorizing Provider  azithromycin (ZITHROMAX Z-PAK) 250 MG tablet Take 2 tablets today, then 1 tablet daily. 02/14/22   McElwee, Lauren A, NP  benzonatate (TESSALON) 100 MG capsule Take 1 capsule (100 mg total)  by mouth 3 (three) times daily as needed for cough. 02/14/22   McElwee, Scheryl Darter, NP  calcium carbonate (TUMS - DOSED IN MG ELEMENTAL CALCIUM) 500 MG chewable tablet Chew 1 tablet by mouth daily as needed for indigestion or heartburn.    [provider]  doxycycline (VIBRA-TABS) 100 MG tablet Take 1 tablet (100 mg total) by mouth 2 (two) times daily. 02/12/22   McElwee, Scheryl Darter, NP  flecainide (TAMBOCOR) 100 MG tablet Take 100 mg by mouth 2 (two) times daily.    [provider]  ibuprofen (ADVIL) 600 MG tablet Take 600 mg by mouth daily. 09/04/21   [provider]  omeprazole (PRILOSEC) 20 MG capsule Take 1 capsule (20 mg total) by mouth daily. 01/22/22   McElwee, Lauren A, NP  ondansetron (ZOFRAN-ODT) 4 MG disintegrating tablet Take 1 tablet (4 mg total) by mouth every 8 (eight) hours as needed for nausea or vomiting. 02/14/22   McElwee, Lauren A, NP  traMADol (ULTRAM) 50 MG tablet Take 50 mg by mouth 2 (two) times daily as needed. 01/06/22   [provider]    Physical Exam: Vitals:   03/24/22 0543 03/24/22 0544 03/24/22 0615  BP: (!) 151/109  (!) 140/81  Pulse: (!) 107    Resp: 20    Temp: 98.8 F (37.1 C)    TempSrc: Oral    SpO2: 94%    Weight:  86.2 kg   Height:  5' 8"$  (1.727 m)    Physical Exam Vitals and nursing note reviewed.  Constitutional:      General: She is awake. She is not in acute distress.    Appearance: She is overweight.  HENT:     Head: Normocephalic.     Left Ear: There is no impacted cerumen.     Nose: No rhinorrhea.     Mouth/Throat:     Mouth: Mucous membranes are moist.  Eyes:     General: No scleral icterus.    Pupils: Pupils are equal, round, and reactive to light.  Neck:     Vascular: No JVD.  Cardiovascular:     Rate and Rhythm: Normal rate and regular rhythm.     Heart sounds: S1 normal and S2 normal.  Pulmonary:     Effort: Pulmonary effort is normal.     Breath sounds: Normal breath sounds.  Abdominal:      General: Bowel sounds are normal. There is no distension.     Palpations: Abdomen is soft.     Tenderness: There is no abdominal tenderness. There is no guarding.  Musculoskeletal:     Cervical back: Neck supple.     Right lower leg: No edema.     Left lower leg: No edema.  Skin:    General: Skin is warm and dry.  Neurological:     General: No focal deficit present.     Mental Status: She is  alert. She is disoriented.  Psychiatric:        Mood and Affect: Mood normal.        Behavior: Behavior is cooperative.     Data Reviewed:  Results are pending, will review when available.  Assessment and Plan: Principal Problem:   Closed left hip fracture, initial encounter ([place]) Admit to telemetry/inpatient. Ice area as needed. Buck's traction per protocol. Analgesics as needed. Antiemetics as needed. Consult TOC team. Consult nutritional services. PT evaluation after surgery. Orthopedic surgery evaluation appreciated.  Active Problems:   Cerebral ventriculomegaly Discussed with neurosurgery. Neurosurgery recommended neurology evaluation. Discussed with neurology on-call. Did not recommend evaluation at the moment. They recommended outpatient follow-up once less acute. Will need OP neurology evaluation after hip fracture recovery.    Atrial fibrillation, unspecified type (HCC) CHA2DS2-VASc Score of at least 3. Not on anticoagulation. Continue flecainide 100 mg p.o. twice daily.    Leukocytosis Likely stress-induced. Follow WBC in AM.    Polycythemia Recheck H&H tomorrow.    Hyperglycemia Check fasting glucose in the morning.    Gastroesophageal reflux disease Continue omeprazole or formulary equivalent.    Chronic back pain Continue current analgesics.     Advance Care Planning:   Code Status: Full Code   Consults: Dorna Leitz, MD (orthopedic surgery).  Family Communication:   Severity of Illness: The appropriate patient status for this patient is  INPATIENT. Inpatient status is judged to be reasonable and necessary in order to provide the required intensity of service to ensure the patient's safety. The patient's presenting symptoms, physical exam findings, and initial radiographic and laboratory data in the context of their chronic comorbidities is felt to place them at high risk for further clinical deterioration. Furthermore, it is not anticipated that the patient will be medically stable for discharge from the hospital within 2 midnights of admission.   * I certify that at the point of admission it is my clinical judgment that the patient will require inpatient hospital care spanning beyond 2 midnights from the point of admission due to high intensity of service, high risk for further deterioration and high frequency of surveillance required.*  Author: Reubin Milan, MD 03/24/2022 7:24 AM  For on call review [email or web address].   This document was prepared using Dragon voice recognition software and may contain some unintended transcription errors.

## 2022-03-24 NOTE — Transfer of Care (Signed)
Immediate Anesthesia Transfer of Care Note  Patient: [name] Dodson  Procedure(s) Performed: LEFT HEMI-ATHROPLASTY (Left: Hip)  Patient Location: PACU  Anesthesia Type:General  Level of Consciousness: sedated and responds to stimulation  Airway & Oxygen Therapy: Patient Spontanous Breathing and Patient connected to face mask oxygen  Post-op Assessment: Report given to RN and Post -op Vital signs reviewed and stable  Post vital signs: Reviewed and stable  Last Vitals:  Vitals Value Taken Time  BP 103/56 03/24/22 1603  Temp    Pulse 70 03/24/22 1605  Resp 14 03/24/22 1605  SpO2 98 % 03/24/22 1605  Vitals shown include unvalidated device data.  Last Pain:  Vitals:   03/24/22 1204  TempSrc: Oral  PainSc: 0-No pain      Patients Stated Pain Goal: 2 ([id number])  Complications: No notable events documented.

## 2022-03-24 NOTE — Anesthesia Postprocedure Evaluation (Signed)
Anesthesia Post Note  Patient: [name] Dodson  Procedure(s) Performed: LEFT HEMI-ATHROPLASTY (Left: Hip)     Patient location during evaluation: PACU Anesthesia Type: General Level of consciousness: awake and alert Pain management: pain level controlled Vital Signs Assessment: post-procedure vital signs reviewed and stable Respiratory status: spontaneous breathing, nonlabored ventilation, respiratory function stable and patient connected to nasal cannula oxygen Cardiovascular status: blood pressure returned to baseline and stable Postop Assessment: no apparent nausea or vomiting Anesthetic complications: no  No notable events documented.  Last Vitals:  Vitals:   03/24/22 1730 03/24/22 1745  BP: 112/66 105/67  Pulse: 70 69  Resp: 20 19  Temp:    SpO2: 94% 93%    Last Pain:  Vitals:   03/24/22 1745  TempSrc:   PainSc: 0-No pain                 Barnet Glasgow

## 2022-03-24 NOTE — ED Notes (Signed)
Pt placed on 2L [place] due to O2 sats of 88% after receiving Morphine

## 2022-03-24 NOTE — Anesthesia Preprocedure Evaluation (Signed)
Anesthesia Evaluation  Patient identified by MRN, date of birth, ID band Patient awake    Reviewed: Allergy & Precautions, NPO status , Patient's Chart, lab work & pertinent test results  History of Anesthesia Complications (+) PONV and history of anesthetic complications  Airway        Dental   Pulmonary neg pulmonary ROS, former smoker          Cardiovascular + dysrhythmias (on flecainide) Atrial Fibrillation      Neuro/Psych negative neurological ROS  negative psych ROS   GI/Hepatic Neg liver ROS,GERD  Medicated,,  Endo/Other  negative endocrine ROS    Renal/GU negative Renal ROS     Musculoskeletal  (+) Arthritis ,    Abdominal   Peds  Hematology negative hematology ROS (+)   Anesthesia Other Findings   Reproductive/Obstetrics negative OB ROS                             Anesthesia Physical Anesthesia Plan  ASA: 2 and emergent  Anesthesia Plan: Regional   Post-op Pain Management:    Induction:   PONV Risk Score and Plan: Treatment may vary due to age or medical condition  Airway Management Planned: Natural Airway  Additional Equipment: None  Intra-op Plan:   Post-operative Plan:   Informed Consent:   Plan Discussed with:   Anesthesia Plan Comments:        Anesthesia Quick Evaluation

## 2022-03-24 NOTE — Consult Note (Signed)
Reason for Consult:77 year old female with left hip pain Referring Physician: hospitalist  [name] Dodson is an 77 y.o. female.  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  She was having significant left hip pain.  She presented to the emergency room where x-rays showed that she had impacted femoral neck fracture.she is admitted via the hospitalist service and we are consult for management of her hip fracture.  Past Medical History:  Diagnosis Date   Arthritis    Atrial fibrillation ([place])    afib 04/08/08 s/p DCCV 05/05/09, s/p CTI ablation 10/02/09; recurrent afib 2016 s/p DCCV 10/27/14. On flecainide, ASA. (Dr. Aggie Cosier)   Chronic back pain    stenosis   Dry skin    Dyspnea    Dysrhythmia    a fib   GERD (gastroesophageal reflux disease)    Tums daily   History of blood transfusion    no abnormal reaction   History of colon polyps    benign   Joint pain    Nocturia    Pneumonia    hx of   PONV (postoperative nausea and vomiting)    Weakness    numbness and tingling in right leg/foot    Past Surgical History:  Procedure Laterality Date   cataract surgery Bilateral    CHOLECYSTECTOMY     COLONOSCOPY     ESOPHAGOGASTRODUODENOSCOPY     heart ablation     HIP SURGERY Right    with plates/screws-partial replacement   LUMBAR LAMINECTOMY/DECOMPRESSION MICRODISCECTOMY N/A 02/14/2016   Procedure: LUMBAR 3-4 DECOMPRESSION;  Surgeon: Phylliss Bob, MD;  Location: [facility];  Service: Orthopedics;  Laterality: N/A;  LUMBAR 3-4 DECOMPRESSION     Family History  Problem Relation Age of Onset   Hypertension Mother    COPD Father     Social History:  reports that she has quit smoking. She has never used smokeless tobacco. She reports current alcohol use. She reports that she does not use drugs.  Allergies:  Allergies  Allergen Reactions   Chlorhexidine Rash    Red rash and burning    Medications: I have reviewed the patient's current  medications.  Results for orders placed or performed during the hospital encounter of 03/24/22 (from the past 48 hour(s))  CBC with Differential/Platelet     Status: Abnormal   Collection Time: 03/24/22  5:50 AM  Result Value Ref Range   WBC 19.6 (H) 4.0 - 10.5 K/uL   RBC 4.20 3.87 - 5.11 MIL/uL   Hemoglobin 15.1 (H) 12.0 - 15.0 g/dL   HCT 41.4 36.0 - 46.0 %   MCV 98.6 80.0 - 100.0 fL   MCH 36.0 (H) 26.0 - 34.0 pg   MCHC 36.5 (H) 30.0 - 36.0 g/dL   RDW 12.9 11.5 - 15.5 %   Platelets 209 150 - 400 K/uL   nRBC 0.0 0.0 - 0.2 %   Neutrophils Relative % 89 %   Neutro Abs 17.6 (H) 1.7 - 7.7 K/uL   Lymphocytes Relative 5 %   Lymphs Abs 0.9 0.7 - 4.0 K/uL   Monocytes Relative 5 %   Monocytes Absolute 1.0 0.1 - 1.0 K/uL   Eosinophils Relative 0 %   Eosinophils Absolute 0.0 0.0 - 0.5 K/uL   Basophils Relative 0 %   Basophils Absolute 0.0 0.0 - 0.1 K/uL   Immature Granulocytes 1 %   Abs Immature Granulocytes 0.13 (H) 0.00 - 0.07 K/uL    Comment:  Performed at [facility], [place] [address] [place], [place] [id number]  Basic metabolic panel     Status: Abnormal   Collection Time: 03/24/22  5:50 AM  Result Value Ref Range   Sodium 137 135 - 145 mmol/L   Potassium 4.0 3.5 - 5.1 mmol/L    Comment: HEMOLYSIS AT THIS LEVEL MAY AFFECT RESULT   Chloride 100 98 - 111 mmol/L   CO2 22 22 - 32 mmol/L   Glucose, Bld 139 (H) 70 - 99 mg/dL    Comment: Glucose reference range applies only to samples taken after fasting for at least 8 hours.   BUN 12 8 - 23 mg/dL   Creatinine, Ser 0.70 0.44 - 1.00 mg/dL   Calcium 9.7 8.9 - 10.3 mg/dL   GFR, Estimated >60 >60 mL/min    Comment: (NOTE) Calculated using the CKD-EPI Creatinine Equation (2021)    Anion gap 15 5 - 15    Comment: Performed at [facility] [address] [place], [place] [address]    CT HEAD WO CONTRAST (5MM)  Result Date: 03/24/2022 CLINICAL DATA:  Fall last night.  Head trauma EXAM: CT HEAD WITHOUT CONTRAST TECHNIQUE:  Contiguous axial images were obtained from the base of the skull through the vertex without intravenous contrast. RADIATION DOSE REDUCTION: This exam was performed according to the departmental dose-optimization program which includes automated exposure control, adjustment of the mA and/or kV according to patient size and/or use of iterative reconstruction technique. COMPARISON:  04/21/2003 FINDINGS: Brain: No evidence of acute infarction, hemorrhage, obstructive hydrocephalus, extra-axial collection or mass lesion/mass effect. Ventriculomegaly primarily attributed to brain atrophy although there is some disproportionate subarachnoid spaces with crowding at the vertex and callosum angle narrowing. Moderate chronic small vessel ischemia in the cerebral white matter. Vascular: No hyperdense vessel or unexpected calcification. Skull: Normal. Negative for fracture or focal lesion. Sinuses/Orbits: No acute finding. IMPRESSION: 1. No evidence of intracranial injury. 2. Ventriculomegaly with brain atrophy. 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 Electronically Signed   By: Jorje Guild M.D.   On: 03/24/2022 06:53   DG Chest 1 View  Result Date: 03/24/2022 CLINICAL DATA:  Fall EXAM: CHEST  1 VIEW COMPARISON:  11/10/2018 FINDINGS: Generous heart size and vascular pedicle width accentuated by technique and rotation. Generalized interstitial coarsening is stable. There is no edema, consolidation, effusion, or pneumothorax. No acute fracture detected. IMPRESSION: Low volume chest without acute finding. Electronically Signed   By: Jorje Guild M.D.   On: 03/24/2022 06:26   DG Hips Bilat W or Wo Pelvis 3-4 Views  Result Date: 03/24/2022 CLINICAL DATA:  Fall EXAM: DG HIP (WITH OR WITHOUT PELVIS) 4V BILAT COMPARISON:  None Available. FINDINGS: Impacted left femoral neck fracture, subcapital. Hemiarthroplasty on the right which is unremarkable. Intact  appearance of the pelvic ring. Generalized osteopenia. IMPRESSION: Impacted left femoral neck fracture. Electronically Signed   By: Jorje Guild M.D.   On: 03/24/2022 06:26    ROS ROS: I have reviewed the patient's review of systems thoroughly and there are no positive responses as relates to the HPI.  Blood pressure 137/73, pulse 83, temperature 97.7 F (36.5 C), temperature source Oral, resp. rate 20, height 5' 8"$  (1.727 m), weight 86.2 kg, SpO2 92 %. Physical Exam Well-developed well-nourished patient in no acute distress. Alert and oriented x3 HEENT:within normal limits Cardiac: Regular rate and rhythm Pulmonary: Lungs clear to auscultation Abdomen: Soft and nontender.  Normal  active bowel sounds  Musculoskeletal: (left hip: Painful range of motion.  Limited range of motion.  No instability.  She is neurovascular intact distally.)  Assessment/Plan: 77 year old severely osteoporotic female who fell earlier today and has impacted femoral neck fracture.  It is impacted in significant valgus.I have had a conversation with the patient and her daughter today.  We talked about the possibility of cannulated screw fixation with the concerns of potential failure and inability to bear full weight.  We talked about the possibility of hemiarthroplasty from an anterior approach.  After thoughtful discussion I think the patient and the daughter prefer hemiarthroplasty and I think that is not unreasonable given the overall situation.I have had a prolonged discussion with the patient regarding the risk and benefits of the surgical procedure.  The patient understands the risks include but are not limited to bleeding, infection and failure of the surgery to cure the problem and need for further surgery.  The patient understands there is a slight risk of death at the time of surgery.  The patient understands these risks along with the potential benefits and wishes to proceed with surgical intervention.  The  patient will be followed in the office in the postoperative period.  Alta Corning 03/24/2022, 9:17 AM

## 2022-03-24 NOTE — OR Nursing (Signed)
Skin tear to left lower leg/ankle area.  Dressing of xeroform and aquacel applied per Epimenio Sarin, PA Kadan Millstein L [place]

## 2022-03-24 NOTE — Brief Op Note (Signed)
03/24/2022  3:13 PM  PATIENT:  [name] Dodson  77 y.o. female  PRE-OPERATIVE DIAGNOSIS:  LEFT FEMORAL NECK FRACTURE  POST-OPERATIVE DIAGNOSIS:  LEFT FEMORAL NECK FRACTURE  PROCEDURE:  Procedure(s): LEFT HEMI-ATHROPLASTY (Left)  SURGEON:  Surgeon(s) and Role:    Dorna Leitz, MD - Primary  PHYSICIAN ASSISTANT: Filbert Berthold PAc  ASSISTANTS: Filbert Berthold   ANESTHESIA:   general  EBL:  300 mL   BLOOD ADMINISTERED:none  DRAINS: none   LOCAL MEDICATIONS USED:  MARCAINE    and OTHER Experel  SPECIMEN:  No Specimen  DISPOSITION OF SPECIMEN:  N/A  COUNTS:  YES  TOURNIQUET:  * No tourniquets in log *  DICTATION: .Other Dictation: Dictation Number [id number]  PLAN OF CARE: Admit to inpatient   PATIENT DISPOSITION:  PACU - hemodynamically stable.   Delay start of Pharmacological VTE agent (>24hrs) due to surgical blood loss or risk of bleeding: no

## 2022-03-24 NOTE — ED Triage Notes (Signed)
Patient presents to ed via [facility] states she went to the bathroom approx. 1 am after using the bathroom washed her hands and that's the last thing she remembered. She woke up on the floor. C/o left hip pain, confused to events of incident, states at one point she never used the bathroom , c/o left hip pain and left knee tenderness.

## 2022-03-24 NOTE — Op Note (Signed)
NAME: [name] Dodson, [name] Dodson MEDICAL RECORD NO: [id number] ACCOUNT NO: [id number] DATE OF BIRTH: 1945-05-08 FACILITY: [facility] LOCATION: [facility] PHYSICIAN: Alta Corning, MD  Operative Report   DATE OF PROCEDURE: 03/24/2022  PREOPERATIVE DIAGNOSIS:  Femoral neck fracture, left.  POSTOPERATIVE DIAGNOSIS:  Femoral neck fracture, left.  PROCEDURE: 1. Bipolar hemiarthroplasty left with the ACTIS stem size 8, a 28 mm ball inside of a 49 mm bipolar head. 2. Interpretation of multiple intra-operative fluoroscopic images.  SURGEON:  Alta Corning, MD.  ASSISTANT:  Filbert Berthold, PA-C who was present entire case and assisted by retraction of the leg, manipulation of tissues and closing to minimize OR time.  INDICATIONS:  The patient is a 77 year old female with a long history significant complaints of left hip pain.  She had fallen at home and suffered a femoral neck fracture.  We were consulted for evaluation and management.  X-ray showed that she had an  femoral neck fracture with mild angulation, displacement and we had a discussion with she and her daughter about treatment options, but ultimately felt that hemiarthroplasty would be appropriate and felt the bipolar given that she had on the other side  would be appropriate course of action and was taken to the operating room for this procedure.  DESCRIPTION OF PROCEDURE:  Patient was taken to the operating room.  After adequate anesthesia was obtained with a general anesthetic, the patient was placed supine on the operating table.  The left hip was prepped and draped in the usual sterile fashion  for an anterior approach to the hip, subcutaneous tissue dissected down to the level of the tensor fascia, it was opened and the muscle was finger dissected and retractors were put in place above the femoral neck.  At that point, the capsule was opened  and tagged anteriorly and posteriorly and attention was turned towards the socket side.  Retractors were put in  place and we took a small fragments of bone out of the acetabulum and there was no evidence of significant arthritis in the acetabulum.   Attention was then turned to the stem side where it was a femoral neck retractor was put in place.  The hip was externally rotated, taken it down over position, and the femur was opened and sequentially rasped to a level of the 8 on the ACTIS, a trial  zero bipolar head was placed and we were able to get it reduced relatively easily.  Leg lengths were checked and felt to be symmetric and actually that was with a 6 stem.  At this point, we felt that the 6 was not going to be adequate in terms of sizing  as it did give some when we took it back up and went up to a 7 and then to 8 at that point, the final head was opened and placed and the stem was hammered into place with the new head in place, a little bit difficulty with this reduction, but ultimately  did a little bit of release of the capsule up superiorly and then we are able to get in place.  Once that was done, the final images were taken.  Excellent sizing of the stem followed by leg length being appropriate and at that point the wounds were  irrigated and suctioned dry.  Capsule was closed with #1 Vicryl.  Tensor fascia closed with 0 Vicryl, skin with 0 Vicryl, 2-0 Vicryl, and 3-0 Monocryl subcuticular for the skin.  Benzoin and Steri-Strips were applied as well as  sterile compressive  dressing as well as a silver impregnated dressing and the patient was taken to recovery room, was noted to be in satisfactory condition.  Estimated blood loss for procedure was approximately 300 mL.   PUS D: 03/24/2022 3:19:11 pm T: 03/24/2022 3:51:00 pm  JOB: [id number]

## 2022-03-24 NOTE — Anesthesia Procedure Notes (Signed)
Procedure Name: Intubation Date/Time: 03/24/2022 1:29 PM  Performed by: Garrel Ridgel, CRNAPre-anesthesia Checklist: Patient identified, Emergency Drugs available, Suction available and Patient being monitored Patient Re-evaluated:Patient Re-evaluated prior to induction Oxygen Delivery Method: Circle system utilized Preoxygenation: Pre-oxygenation with 100% oxygen Induction Type: IV induction, Cricoid Pressure applied and Rapid sequence Ventilation: Mask ventilation without difficulty Laryngoscope Size: Mac and 3 Grade View: Grade II Tube type: Oral Tube size: 7.0 mm Number of attempts: 1 Airway Equipment and Method: Stylet Placement Confirmation: ETT inserted through vocal cords under direct vision, positive ETCO2 and breath sounds checked- equal and bilateral Secured at: 22 cm Tube secured with: Tape Dental Injury: Teeth and Oropharynx as per pre-operative assessment

## 2022-03-24 NOTE — Progress Notes (Signed)
PROGRESS NOTE    [name] Dodson  [id number] DOB: [birth year] DOA: 03/24/2022 PCP: [name], NP     Brief Narrative:  77 year old WF PMHx A-fib s/p DCCV 9/23 2016, dysarthria, chronic back pain, GERD,  community ambulator who fell earlier today. She was having significant left hip pain. She presented to the emergency room where x-rays showed that she had impacted femoral neck fracture.she is admitted via the hospitalist service and we are consult for management of her hip fracture.    Subjective: Attempted to see patient twice today.  Patient still in OR no charge   Assessment & Plan: Covid vaccination;   Principal Problem:   Closed left hip fracture, initial encounter [facility]) Active Problems:   Atrial fibrillation, unspecified type (HCC)   Gastroesophageal reflux disease   Chronic back pain  LEFT closed hip fracture  A-fib unspecified type  Chronic back pain  GERD           Mobility Assessment (last 72 hours)     Mobility Assessment   No documentation.                 DVT prophylaxis:  Code Status:  Family Communication:  Status is: Inpatient    Dispo: The patient is from: Home              Anticipated d/c is to: SNF              Anticipated d/c date is: 3 days              Patient currently is not medically stable to d/c.      Consultants:  Orthopedic surgery  Procedures/Significant Events:    I have personally reviewed and interpreted all radiology studies and my findings are as above.  VENTILATOR SETTINGS:    Cultures   Antimicrobials: Anti-infectives (From admission, onward)    Start     Dose/Rate Route Frequency Ordered Stop   03/25/22 0600  ceFAZolin (ANCEF) IVPB 2g/100 mL premix        2 g 200 mL/hr over 30 Minutes Intravenous On call to O.R. 03/24/22 1220 03/24/22 1338   03/24/22 1224  ceFAZolin (ANCEF) 2-4 GM/100ML-% IVPB       Note to Pharmacy: Lysle Pearl: cabinet override      03/24/22 1224  03/24/22 1335         Devices    LINES / TUBES:      Continuous Infusions:   Objective: Vitals:   03/24/22 0730 03/24/22 0745 03/24/22 0800 03/24/22 0916  BP: 139/80 133/81 126/73 137/73  Pulse: 93 90 91 83  Resp: 16 20 18 20  $ Temp:    97.7 F (36.5 C)  TempSrc:    Oral  SpO2: 98% 97% 92% 92%  Weight:      Height:       No intake or output data in the 24 hours ending 03/24/22 1122 Filed Weights   03/24/22 0544  Weight: 86.2 kg    Examination:  Attempted to see patient twice today.  Patient still in [place] no charge .     Data Reviewed: Care during the described time interval was provided by me .  I have reviewed this patient's available data, including medical history, events of note, physical examination, and all test results as part of my evaluation.  CBC: Recent Labs  Lab 03/24/22 0550  WBC 19.6*  NEUTROABS 17.6*  HGB 15.1*  HCT 41.4  MCV 98.6  PLT 209  Basic Metabolic Panel: Recent Labs  Lab 03/24/22 0550  NA 137  K 4.0  CL 100  CO2 22  GLUCOSE 139*  BUN 12  CREATININE 0.70  CALCIUM 9.7   GFR: Estimated Creatinine Clearance: 65.5 mL/min (by C-G formula based on SCr of 0.7 mg/dL). Liver Function Tests: No results for input(s): "AST", "ALT", "ALKPHOS", "BILITOT", "PROT", "ALBUMIN" in the last 168 hours. No results for input(s): "LIPASE", "AMYLASE" in the last 168 hours. No results for input(s): "AMMONIA" in the last 168 hours. Coagulation Profile: No results for input(s): "INR", "PROTIME" in the last 168 hours. Cardiac Enzymes: No results for input(s): "CKTOTAL", "CKMB", "CKMBINDEX", "TROPONINI" in the last 168 hours. BNP (last 3 results) No results for input(s): "PROBNP" in the last 8760 hours. HbA1C: No results for input(s): "HGBA1C" in the last 72 hours. CBG: No results for input(s): "GLUCAP" in the last 168 hours. Lipid Profile: No results for input(s): "CHOL", "HDL", "LDLCALC", "TRIG", "CHOLHDL", "LDLDIRECT" in the last 72  hours. Thyroid Function Tests: No results for input(s): "TSH", "T4TOTAL", "FREET4", "T3FREE", "THYROIDAB" in the last 72 hours. Anemia Panel: No results for input(s): "VITAMINB12", "FOLATE", "FERRITIN", "TIBC", "IRON", "RETICCTPCT" in the last 72 hours. Sepsis Labs: No results for input(s): "PROCALCITON", "LATICACIDVEN" in the last 168 hours.  No results found for this or any previous visit (from the past 240 hour(s)).       Radiology Studies: CT HEAD WO CONTRAST (5MM)  Result Date: 03/24/2022 CLINICAL DATA:  Fall last night.  Head trauma EXAM: CT HEAD WITHOUT CONTRAST TECHNIQUE: Contiguous axial images were obtained from the base of the skull through the vertex without intravenous contrast. RADIATION DOSE REDUCTION: This exam was performed according to the departmental dose-optimization program which includes automated exposure control, adjustment of the mA and/or kV according to patient size and/or use of iterative reconstruction technique. COMPARISON:  04/21/2003 FINDINGS: Brain: No evidence of acute infarction, hemorrhage, obstructive hydrocephalus, extra-axial collection or mass lesion/mass effect. Ventriculomegaly primarily attributed to brain atrophy although there is some disproportionate subarachnoid spaces with crowding at the vertex and callosum angle narrowing. Moderate chronic small vessel ischemia in the cerebral white matter. Vascular: No hyperdense vessel or unexpected calcification. Skull: Normal. Negative for fracture or focal lesion. Sinuses/Orbits: No acute finding. IMPRESSION: 1. No evidence of intracranial injury. 2. Ventriculomegaly with brain atrophy. 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 Electronically Signed   By: Jorje Guild M.D.   On: 03/24/2022 06:53   DG Chest 1 View  Result Date: 03/24/2022 CLINICAL DATA:  Fall EXAM: CHEST  1 VIEW COMPARISON:  11/10/2018 FINDINGS: Generous heart size and vascular  pedicle width accentuated by technique and rotation. Generalized interstitial coarsening is stable. There is no edema, consolidation, effusion, or pneumothorax. No acute fracture detected. IMPRESSION: Low volume chest without acute finding. Electronically Signed   By: Jorje Guild M.D.   On: 03/24/2022 06:26   DG Hips Bilat W or Wo Pelvis 3-4 Views  Result Date: 03/24/2022 CLINICAL DATA:  Fall EXAM: DG HIP (WITH OR WITHOUT PELVIS) 4V BILAT COMPARISON:  None Available. FINDINGS: Impacted left femoral neck fracture, subcapital. Hemiarthroplasty on the right which is unremarkable. Intact appearance of the pelvic ring. Generalized osteopenia. IMPRESSION: Impacted left femoral neck fracture. Electronically Signed   By: Jorje Guild M.D.   On: 03/24/2022 06:26        Scheduled Meds:  flecainide  100 mg Oral BID   ketorolac  15 mg Intravenous Once   [  START ON 03/25/2022] pantoprazole  40 mg Oral Daily   pantoprazole (PROTONIX) IV  40 mg Intravenous Once   Continuous Infusions:   LOS: 0 days    Time spent:0 min    Alexine Pilant, Geraldo Docker, MD [facility]   If 7PM-7AM, please contact night-coverage 03/24/2022, 11:22 AM

## 2022-03-24 NOTE — ED Provider Notes (Addendum)
[facility] Provider Note   CSN: [id number] Arrival date & time: 03/24/22  [id number]     History  Chief Complaint  Patient presents with   [name] Dodson is a 77 y.o. female.  Patient presents to the emergency department for evaluation after a fall.  Patient reports that she got up to go to the bathroom tonight.  When she was walking back to her bedroom she lost her balance and fell.  She hit the front of her head on the floor, no loss of consciousness.  Patient complaining of bilateral hip pain, left greater than right.       Home Medications Prior to Admission medications   Medication Sig Start Date End Date Taking? Authorizing Provider  azithromycin (ZITHROMAX Z-PAK) 250 MG tablet Take 2 tablets today, then 1 tablet daily. 02/14/22   McElwee, Lauren A, NP  benzonatate (TESSALON) 100 MG capsule Take 1 capsule (100 mg total) by mouth 3 (three) times daily as needed for cough. 02/14/22   McElwee, Scheryl Darter, NP  calcium carbonate (TUMS - DOSED IN MG ELEMENTAL CALCIUM) 500 MG chewable tablet Chew 1 tablet by mouth daily as needed for indigestion or heartburn.    [provider]  doxycycline (VIBRA-TABS) 100 MG tablet Take 1 tablet (100 mg total) by mouth 2 (two) times daily. 02/12/22   McElwee, Scheryl Darter, NP  flecainide (TAMBOCOR) 100 MG tablet Take 100 mg by mouth 2 (two) times daily.    [provider]  ibuprofen (ADVIL) 600 MG tablet Take 600 mg by mouth daily. 09/04/21   [provider]  omeprazole (PRILOSEC) 20 MG capsule Take 1 capsule (20 mg total) by mouth daily. 01/22/22   McElwee, Lauren A, NP  ondansetron (ZOFRAN-ODT) 4 MG disintegrating tablet Take 1 tablet (4 mg total) by mouth every 8 (eight) hours as needed for nausea or vomiting. 02/14/22   McElwee, Lauren A, NP  traMADol (ULTRAM) 50 MG tablet Take 50 mg by mouth 2 (two) times daily as needed. 01/06/22   [provider]      Allergies     Chlorhexidine    Review of Systems   Review of Systems  Physical Exam Updated Vital Signs BP (!) 151/109 (BP Location: Right Arm)   Pulse (!) 107   Temp 98.8 F (37.1 C) (Oral)   Resp 20   Ht 5' 8"$  (1.727 m)   Wt 86.2 kg   SpO2 94%   BMI 28.89 kg/m  Physical Exam Vitals and nursing note reviewed.  Constitutional:      General: She is not in acute distress.    Appearance: She is well-developed.  HENT:     Head: Normocephalic and atraumatic.     Mouth/Throat:     Mouth: Mucous membranes are moist.  Eyes:     General: Vision grossly intact. Gaze aligned appropriately.     Extraocular Movements: Extraocular movements intact.     Conjunctiva/sclera: Conjunctivae normal.  Cardiovascular:     Rate and Rhythm: Normal rate and regular rhythm.     Pulses: Normal pulses.     Heart sounds: Normal heart sounds, S1 normal and S2 normal. No murmur heard.    No friction rub. No gallop.  Pulmonary:     Effort: Pulmonary effort is normal. No respiratory distress.     Breath sounds: Normal breath sounds.  Abdominal:     General: Bowel sounds are normal.     Palpations:  Abdomen is soft.     Tenderness: There is no abdominal tenderness. There is no guarding or rebound.     Hernia: No hernia is present.  Musculoskeletal:        General: No swelling.     Cervical back: Full passive range of motion without pain, normal range of motion and neck supple. No spinous process tenderness or muscular tenderness. Normal range of motion.     Right hip: Tenderness present. No deformity.     Left hip: Tenderness present. No deformity.     Right lower leg: No edema.     Left lower leg: No edema.  Skin:    General: Skin is warm and dry.     Capillary Refill: Capillary refill takes less than 2 seconds.     Findings: No ecchymosis, erythema, rash or wound.  Neurological:     General: No focal deficit present.     Mental Status: She is alert and oriented to person, place, and time.     GCS: GCS eye  subscore is 4. GCS verbal subscore is 5. GCS motor subscore is 6.     Cranial Nerves: Cranial nerves 2-12 are intact.     Sensory: Sensation is intact.     Motor: Motor function is intact.     Coordination: Coordination is intact.  Psychiatric:        Attention and Perception: Attention normal.        Mood and Affect: Mood normal.        Speech: Speech normal.        Behavior: Behavior normal.     ED Results / Procedures / Treatments   Labs (all labs ordered are listed, but only abnormal results are displayed) Labs Reviewed  CBC WITH DIFFERENTIAL/PLATELET - Abnormal; Notable for the following components:      Result Value   WBC 19.6 (*)    Hemoglobin 15.1 (*)    MCH 36.0 (*)    MCHC 36.5 (*)    Neutro Abs 17.6 (*)    Abs Immature Granulocytes 0.13 (*)    All other components within normal limits  BASIC METABOLIC PANEL - Abnormal; Notable for the following components:   Glucose, Bld 139 (*)    All other components within normal limits    EKG EKG Interpretation  Date/Time:  Monday March 24 2022 05:40:05 EST Ventricular Rate:  108 PR Interval:  187 QRS Duration: 92 QT Interval:  339 QTC Calculation: 455 R Axis:   -61 Text Interpretation: Sinus tachycardia Abnormal R-wave progression, early transition Inferior infarct, old Confirmed by Orpah Greek [phone]) on 03/24/2022 6:23:40 AM  Radiology CT HEAD WO CONTRAST (5MM)  Result Date: 03/24/2022 CLINICAL DATA:  Fall last night.  Head trauma EXAM: CT HEAD WITHOUT CONTRAST TECHNIQUE: Contiguous axial images were obtained from the base of the skull through the vertex without intravenous contrast. RADIATION DOSE REDUCTION: This exam was performed according to the departmental dose-optimization program which includes automated exposure control, adjustment of the mA and/or kV according to patient size and/or use of iterative reconstruction technique. COMPARISON:  04/21/2003 FINDINGS: Brain: No evidence of acute infarction,  hemorrhage, obstructive hydrocephalus, extra-axial collection or mass lesion/mass effect. Ventriculomegaly primarily attributed to brain atrophy although there is some disproportionate subarachnoid spaces with crowding at the vertex and callosum angle narrowing. Moderate chronic small vessel ischemia in the cerebral white matter. Vascular: No hyperdense vessel or unexpected calcification. Skull: Normal. Negative for fracture or focal lesion. Sinuses/Orbits: No acute finding. IMPRESSION:  1. No evidence of intracranial injury. 2. Ventriculomegaly with brain atrophy. 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 Electronically Signed   By: Jorje Guild M.D.   On: 03/24/2022 06:53   DG Chest 1 View  Result Date: 03/24/2022 CLINICAL DATA:  Fall EXAM: CHEST  1 VIEW COMPARISON:  11/10/2018 FINDINGS: Generous heart size and vascular pedicle width accentuated by technique and rotation. Generalized interstitial coarsening is stable. There is no edema, consolidation, effusion, or pneumothorax. No acute fracture detected. IMPRESSION: Low volume chest without acute finding. Electronically Signed   By: Jorje Guild M.D.   On: 03/24/2022 06:26   DG Hips Bilat W or Wo Pelvis 3-4 Views  Result Date: 03/24/2022 CLINICAL DATA:  Fall EXAM: DG HIP (WITH OR WITHOUT PELVIS) 4V BILAT COMPARISON:  None Available. FINDINGS: Impacted left femoral neck fracture, subcapital. Hemiarthroplasty on the right which is unremarkable. Intact appearance of the pelvic ring. Generalized osteopenia. IMPRESSION: Impacted left femoral neck fracture. Electronically Signed   By: Jorje Guild M.D.   On: 03/24/2022 06:26    Procedures Procedures    Medications Ordered in ED Medications  ondansetron (ZOFRAN) injection 4 mg (4 mg Intravenous Given 03/24/22 0549)  morphine (PF) 4 MG/ML injection 4 mg (4 mg Intravenous Given 03/24/22 0549)    ED Course/ Medical Decision Making/ A&P                              Medical Decision Making Amount and/or Complexity of Data Reviewed External Data Reviewed: labs, ECG and notes. Labs: ordered. Decision-making details documented in ED Course. Radiology: ordered and independent interpretation performed. Decision-making details documented in ED Course.  Risk Prescription drug management. Decision regarding hospitalization.   Patient presents to the emergency department for evaluation of bilateral hip pain after a fall.  X-ray shows that she has had prior right hip replacement but there is an impacted fracture of the left femoral neck.  Patient reports that she has been seen at [facility] orthopedic and would like someone in that group to see her for her hip fracture.  She is not on blood thinners.  CT head without injury.  No neck pain, cervical spine cleared by Nexus criteria.  X-ray findings discussed with Dr. Berenice Primas, orthopedics.  Will see the patient in the ED.       Final Clinical Impression(s) / ED Diagnoses Final diagnoses:  Closed fracture of left hip, initial encounter  [facility])    Rx / DC Orders ED Discharge Orders     None         Mutasim Tuckey, Gwenyth Allegra, MD 03/24/22 [id number]    Orpah Greek, MD 03/24/22 [phone]

## 2022-03-24 NOTE — Anesthesia Preprocedure Evaluation (Addendum)
Anesthesia Evaluation  Patient identified by MRN, date of birth, ID band Patient awake    Reviewed: Allergy & Precautions, NPO status , Patient's Chart, lab work & pertinent test results  History of Anesthesia Complications (+) PONV and history of anesthetic complications  Airway Mallampati: II  TM Distance: >3 FB Neck ROM: Full    Dental  (+) Missing,    Pulmonary former smoker   Pulmonary exam normal        Cardiovascular Normal cardiovascular exam+ dysrhythmias Atrial Fibrillation      Neuro/Psych S/p lumbar laminectomy/discectomy 2018    GI/Hepatic Neg liver ROS,GERD  Medicated,,  Endo/Other  negative endocrine ROS    Renal/GU negative Renal ROS  negative genitourinary   Musculoskeletal  (+) Arthritis ,  LEFT FEMORAL NECK FRACTURE   Abdominal   Peds  Hematology negative hematology ROS (+)   Anesthesia Other Findings Day of surgery medications reviewed with patient.  Reproductive/Obstetrics negative OB ROS                             Anesthesia Physical Anesthesia Plan  ASA: 2  Anesthesia Plan: General   Post-op Pain Management: Tylenol PO (pre-op)*   Induction: Intravenous and Rapid sequence  PONV Risk Score and Plan: 4 or greater and Treatment may vary due to age or medical condition, Propofol infusion, Ondansetron, Dexamethasone and TIVA  Airway Management Planned: Oral ETT  Additional Equipment: None  Intra-op Plan:   Post-operative Plan: Extubation in OR  Informed Consent: I have reviewed the patients History and Physical, chart, labs and discussed the procedure including the risks, benefits and alternatives for the proposed anesthesia with the patient or authorized representative who has indicated his/her understanding and acceptance.       Plan Discussed with: CRNA  Anesthesia Plan Comments: (Patient nauseated in preop. Plan for GETA/RSI. Daiva Huge, MD)        Anesthesia Quick Evaluation

## 2022-03-24 NOTE — Progress Notes (Signed)
Initial Nutrition Assessment  INTERVENTION:   -Ensure Plus High Protein po BID, each supplement provides 350 kcal and 20 grams of protein.   NUTRITION DIAGNOSIS:   Increased nutrient needs related to post-op healing, hip fracture as evidenced by estimated needs.  GOAL:   Patient will meet greater than or equal to 90% of their needs  MONITOR:   PO intake, Supplement acceptance, Labs, Weight trends, I & O's  REASON FOR ASSESSMENT:   Consult Hip fracture protocol  ASSESSMENT:   77 year old severely osteoporotic female who fell earlier today and has impacted femoral neck fracture.  Patient in room, family at bedside. Pt NPO awaiting hip surgery today. Pt's family states she eats 2 meals a day, a late breakfast of toast and coffee and then has a good dinner meals that varies. Family concerned as pt has a history of vomiting following previous surgeries. Pt is agreeable to trying protein shakes following surgery to aid in recovery.   Per weight records, pt has lost 17 lbs since 9/15 (8% wt loss x 5 months, insignificant for time frame).   Medications reviewed.  Labs reviewed.  NUTRITION - FOCUSED PHYSICAL EXAM:  No depletions noted.  Diet Order:   Diet Order             Diet NPO time specified  Diet effective now                   EDUCATION NEEDS:   No education needs have been identified at this time  Skin:  Skin Assessment: Reviewed RN Assessment  Last BM:  PTA  Height:   Ht Readings from Last 1 Encounters:  03/24/22 5' 8"$  (1.727 m)    Weight:   Wt Readings from Last 1 Encounters:  03/24/22 86 kg    BMI:  Body mass index is 28.83 kg/m.  Estimated Nutritional Needs:   Kcal:  1800-2000  Protein:  80-90g  Fluid:  2L/day  Clayton Bibles, MS, RD, LDN Inpatient Clinical Dietitian Contact information available via Amion

## 2022-03-25 ENCOUNTER — Encounter: Payer: Self-pay | Admitting: Nurse Practitioner

## 2022-03-25 ENCOUNTER — Encounter (HOSPITAL_COMMUNITY): Payer: Self-pay | Admitting: Orthopedic Surgery

## 2022-03-25 DIAGNOSIS — K219 Gastro-esophageal reflux disease without esophagitis: Secondary | ICD-10-CM | POA: Diagnosis not present

## 2022-03-25 DIAGNOSIS — I4891 Unspecified atrial fibrillation: Secondary | ICD-10-CM | POA: Diagnosis not present

## 2022-03-25 DIAGNOSIS — D72829 Elevated white blood cell count, unspecified: Secondary | ICD-10-CM

## 2022-03-25 DIAGNOSIS — S72002A Fracture of unspecified part of neck of left femur, initial encounter for closed fracture: Secondary | ICD-10-CM | POA: Diagnosis not present

## 2022-03-25 DIAGNOSIS — G8929 Other chronic pain: Secondary | ICD-10-CM

## 2022-03-25 DIAGNOSIS — M545 Low back pain, unspecified: Secondary | ICD-10-CM

## 2022-03-25 DIAGNOSIS — R739 Hyperglycemia, unspecified: Secondary | ICD-10-CM

## 2022-03-25 DIAGNOSIS — Z96649 Presence of unspecified artificial hip joint: Secondary | ICD-10-CM | POA: Diagnosis not present

## 2022-03-25 LAB — COMPREHENSIVE METABOLIC PANEL
ALT: 20 U/L (ref 0–44)
AST: 32 U/L (ref 15–41)
Albumin: 3.1 g/dL — ABNORMAL LOW (ref 3.5–5.0)
Alkaline Phosphatase: 42 U/L (ref 38–126)
Anion gap: 8 (ref 5–15)
BUN: 20 mg/dL (ref 8–23)
CO2: 24 mmol/L (ref 22–32)
Calcium: 8.9 mg/dL (ref 8.9–10.3)
Chloride: 102 mmol/L (ref 98–111)
Creatinine, Ser: 0.8 mg/dL (ref 0.44–1.00)
GFR, Estimated: >60 mL/min (ref >60)
Glucose, Bld: 202 mg/dL — ABNORMAL HIGH (ref 70–99)
Potassium: 3.9 mmol/L (ref 3.5–5.1)
Sodium: 134 mmol/L — ABNORMAL LOW (ref 135–145)
Total Bilirubin: 0.8 mg/dL (ref 0.3–1.2)
Total Protein: 5.7 g/dL — ABNORMAL LOW (ref 6.5–8.1)

## 2022-03-25 LAB — CBC WITH DIFFERENTIAL/PLATELET
Abs Immature Granulocytes: 0.06 10*3/uL (ref 0.00–0.07)
Basophils Absolute: 0 10*3/uL (ref 0.0–0.1)
Basophils Relative: 0 %
Eosinophils Absolute: 0 10*3/uL (ref 0.0–0.5)
Eosinophils Relative: 0 %
HCT: 33.5 % — ABNORMAL LOW (ref 36.0–46.0)
Hemoglobin: 11.3 g/dL — ABNORMAL LOW (ref 12.0–15.0)
Immature Granulocytes: 0 %
Lymphocytes Relative: 7 %
Lymphs Abs: 1 10*3/uL (ref 0.7–4.0)
MCH: 35 pg — ABNORMAL HIGH (ref 26.0–34.0)
MCHC: 33.7 g/dL (ref 30.0–36.0)
MCV: 103.7 fL — ABNORMAL HIGH (ref 80.0–100.0)
Monocytes Absolute: 0.8 10*3/uL (ref 0.1–1.0)
Monocytes Relative: 6 %
Neutro Abs: 12.4 10*3/uL — ABNORMAL HIGH (ref 1.7–7.7)
Neutrophils Relative %: 87 %
Platelets: 157 10*3/uL (ref 150–400)
RBC: 3.23 MIL/uL — ABNORMAL LOW (ref 3.87–5.11)
RDW: 13.1 % (ref 11.5–15.5)
WBC: 14.2 10*3/uL — ABNORMAL HIGH (ref 4.0–10.5)
nRBC: 0 % (ref 0.0–0.2)

## 2022-03-25 LAB — GLUCOSE, CAPILLARY
Glucose-Capillary: 168 mg/dL — ABNORMAL HIGH (ref 70–99)
Glucose-Capillary: 141 mg/dL — ABNORMAL HIGH (ref 70–99)
Glucose-Capillary: 205 mg/dL — ABNORMAL HIGH (ref 70–99)
Glucose-Capillary: 146 mg/dL — ABNORMAL HIGH (ref 70–99)

## 2022-03-25 LAB — PHOSPHORUS: Phosphorus: 3.1 mg/dL (ref 2.5–4.6)

## 2022-03-25 LAB — MAGNESIUM: Magnesium: 2.1 mg/dL (ref 1.7–2.4)

## 2022-03-25 NOTE — Progress Notes (Signed)
PROGRESS NOTE    [name] Dodson  [id number] DOB: 07/15/1945 DOA: 03/24/2022 PCP: [name], NP     Brief Narrative:  77 year old WF PMHx A-fib s/p DCCV 9/23 2016, dysarthria, chronic back pain, GERD,  community ambulator who fell earlier today. She was having significant left hip pain. She presented to the emergency room where x-rays showed that she had impacted femoral neck fracture.she is admitted via the hospitalist service and we are consult for management of her hip fracture.    Subjective: 2/20 A/O x 4 patient states unless she has 0/10 pain she refuses to participate with PT/OT.,    Assessment & Plan: Covid vaccination;   Principal Problem:   Status post hip hemiarthroplasty Active Problems:   Atrial fibrillation, unspecified type ([place])   Gastroesophageal reflux disease   Closed left hip fracture, initial encounter ([place])   Chronic back pain   Cerebral ventriculomegaly   Leukocytosis   Polycythemia   Hyperglycemia  LEFT closed hip fracture -2/19 LEFT HEMI-ATHROPLASTY (Left)  -2/20 per orthopedic surgery Pt to be up with therapy for transfer and gait training. From an orthopedic standpoint, she is doing well. She will remain admitted until able to safely transfer and gait training complete. -2/20 patient's daughter and husband at bedside attempted to convince her that there would be some pain with rehabilitation but she needed to follow our orders.  Patient again stated and that she had 0/10 pain would not participate. -2/20 orthopedic surgery to manage pain medication.  Chronic back pain -See hip fracture  A-fib unspecified type -Per MAR not on anticoagulant.  Given patient's reluctance to participate in any sort of PT and less heavily medicated would not recommend starting anticoagulation (fall risk) - 2/20 NSR  GERD -Protonix 40 mg daily  Hyperglycemia - 2/20 hemoglobin A1c pending - Moderate SSI CBG (last 3)  Recent Labs    03/25/22 1203  03/25/22 1643 03/25/22 2006  GLUCAP 141* 205* 146*              Mobility Assessment (last 72 hours)     Mobility Assessment     Row Name 03/24/22 0945           Does patient have an order for bedrest or is patient medically unstable Yes- Bedfast (Level 1) - Complete                      DVT prophylaxis: Restart subcu heparin 2/21 (clear with surgery first) Code Status: Full Family Communication: 2/20 husband and daughter at bedside for discussion of plan of care all questions answered Status is: Inpatient    Dispo: The patient is from: Home              Anticipated d/c is to: SNF              Anticipated d/c date is: 3 days              Patient currently is not medically stable to d/c.      Consultants:  Orthopedic surgery  Procedures/Significant Events:  2/19 LEFT HEMI-ATHROPLASTY (Left)    I have personally reviewed and interpreted all radiology studies and my findings are as above.  VENTILATOR SETTINGS:    Cultures   Antimicrobials: Anti-infectives (From admission, onward)    Start     Dose/Rate Route Frequency Ordered Stop   03/25/22 0600  ceFAZolin (ANCEF) IVPB 2g/100 mL premix        2 g 200 mL/hr  over 30 Minutes Intravenous On call to O.R. 03/24/22 1220 03/25/22 0817   03/24/22 2000  ceFAZolin (ANCEF) IVPB 2g/100 mL premix        2 g 200 mL/hr over 30 Minutes Intravenous Every 6 hours 03/24/22 1808 03/25/22 0348   03/24/22 1224  ceFAZolin (ANCEF) 2-4 GM/100ML-% IVPB       Note to Pharmacy: Lysle Pearl: cabinet override      03/24/22 1224 03/24/22 1335         Devices    LINES / TUBES:      Continuous Infusions:  [COMPLETED]  ceFAZolin (ANCEF) IV     dextrose 5 % and 0.45% NaCl 75 mL/hr at 03/25/22 0543   methocarbamol (ROBAXIN) IV       Objective: Vitals:   03/24/22 1745 03/24/22 2105 03/25/22 0134 03/25/22 0538  BP: 105/67 116/64 116/61 125/63  Pulse: 69 71 72 65  Resp: 19 16 20 16  $ Temp:  98.6 F  (37 C) 98 F (36.7 C) 97.8 F (36.6 C)  TempSrc:  Oral Oral Oral  SpO2: 93% 90% 92% 90%  Weight:      Height:        Intake/Output Summary (Last 24 hours) at 03/25/2022 [id number] Last data filed at 03/24/2022 [id number] Gross per 24 hour  Intake 1545.19 ml  Output 425 ml  Net 1120.19 ml   Filed Weights   03/24/22 0544 03/24/22 1204  Weight: 86.2 kg 86 kg    Physical Exam:  General: A/O x 4, No acute respiratory distress Eyes: negative scleral hemorrhage, negative anisocoria, negative icterus ENT: Negative Runny nose, negative gingival bleeding, Neck:  Negative scars, masses, torticollis, lymphadenopathy, JVD Lungs: Clear to auscultation bilaterally without wheezes or crackles Cardiovascular: Regular rate and rhythm without murmur gallop or rub normal S1 and S2 Abdomen: negative abdominal pain, nondistended, positive soft, bowel sounds, no rebound, no ascites, no appreciable mass Extremities: patient refused to move LEFT leg, or participate with PT. Skin: LEFT thigh incision covered in clean Psychiatric:  Negative depression, negative anxiety, negative fatigue, negative mania  Central nervous system:  negative dysarthria, negative expressive aphasia, negative receptive aphasia.  .     Data Reviewed: Care during the described time interval was provided by me .  I have reviewed this patient's available data, including medical history, events of note, physical examination, and all test results as part of my evaluation.  CBC: Recent Labs  Lab 03/24/22 0550 03/25/22 0526  WBC 19.6* 14.2*  NEUTROABS 17.6* 12.4*  HGB 15.1* 11.3*  HCT 41.4 33.5*  MCV 98.6 103.7*  PLT 209 [id number]    Basic Metabolic Panel: Recent Labs  Lab 03/24/22 0550 03/25/22 0526  NA 137 134*  K 4.0 3.9  CL 100 102  CO2 22 24  GLUCOSE 139* 202*  BUN 12 20  CREATININE 0.70 0.80  CALCIUM 9.7 8.9  MG  --  2.1  PHOS  --  3.1    GFR: Estimated Creatinine Clearance: 65.4 mL/min (by C-G formula based on SCr  of 0.8 mg/dL). Liver Function Tests: Recent Labs  Lab 03/25/22 0526  AST 32  ALT 20  ALKPHOS 42  BILITOT 0.8  PROT 5.7*  ALBUMIN 3.1*   No results for input(s): "LIPASE", "AMYLASE" in the last 168 hours. No results for input(s): "AMMONIA" in the last 168 hours. Coagulation Profile: No results for input(s): "INR", "PROTIME" in the last 168 hours. Cardiac Enzymes: No results for input(s): "CKTOTAL", "CKMB", "CKMBINDEX", "TROPONINI" in the last 168  hours. BNP (last 3 results) No results for input(s): "PROBNP" in the last 8760 hours. HbA1C: No results for input(s): "HGBA1C" in the last 72 hours. CBG: Recent Labs  Lab 03/24/22 2102  GLUCAP 180*   Lipid Profile: No results for input(s): "CHOL", "HDL", "LDLCALC", "TRIG", "CHOLHDL", "LDLDIRECT" in the last 72 hours. Thyroid Function Tests: No results for input(s): "TSH", "T4TOTAL", "FREET4", "T3FREE", "THYROIDAB" in the last 72 hours. Anemia Panel: No results for input(s): "VITAMINB12", "FOLATE", "FERRITIN", "TIBC", "IRON", "RETICCTPCT" in the last 72 hours. Sepsis Labs: No results for input(s): "PROCALCITON", "LATICACIDVEN" in the last 168 hours.  Recent Results (from the past 240 hour(s))  Surgical pcr screen     Status: None   Collection Time: 03/24/22 12:12 PM   Specimen: Nasal Mucosa; Nasal Swab  Result Value Ref Range Status   MRSA, PCR NEGATIVE NEGATIVE Final   Staphylococcus aureus NEGATIVE NEGATIVE Final    Comment: (NOTE) The Xpert SA Assay (FDA approved for NASAL specimens in patients 26 years of age and older), is one component of a comprehensive surveillance program. It is not intended to diagnose infection nor to guide or monitor treatment. Performed at [facility], [place] [address] [place], [place] [address]          Radiology Studies: Pelvis Portable  Result Date: 03/24/2022 CLINICAL DATA:  Status post left total hip arthroplasty EXAM: PORTABLE PELVIS 1-2 VIEWS COMPARISON:   03/24/2022, 5:50 a.m. FINDINGS: Patient has undergone a previous right hip arthroplasty and there has been an interval left hip arthroplasty with grossly anatomic alignment. IMPRESSION: Status post bilateral hip arthroplasty. Electronically Signed   By: Sammie Bench M.D.   On: 03/24/2022 16:47   DG HIP UNILAT WITH PELVIS 1V LEFT  Result Date: 03/24/2022 CLINICAL DATA:  Hip replacement EXAM: DG HIP (WITH OR WITHOUT PELVIS) 1V*L* COMPARISON:  03/24/2022 FINDINGS: Two low resolution intraoperative spot views of the left hip. Total fluoroscopy time 6 seconds, fluoroscopic dose of 0.9362 mGy. Images demonstrate previous right hip replacement. There is interval left hip replacement with normal alignment. IMPRESSION: Intraoperative fluoroscopic assistance provided during left hip replacement. Electronically Signed   By: Donavan Foil M.D.   On: 03/24/2022 15:29   DG C-Arm 1-60 Min-No Report  Result Date: 03/24/2022 Fluoroscopy was utilized by the requesting physician.  No radiographic interpretation.   DG C-Arm 1-60 Min-No Report  Result Date: 03/24/2022 Fluoroscopy was utilized by the requesting physician.  No radiographic interpretation.   CT HEAD WO CONTRAST (5MM)  Result Date: 03/24/2022 CLINICAL DATA:  Fall last night.  Head trauma EXAM: CT HEAD WITHOUT CONTRAST TECHNIQUE: Contiguous axial images were obtained from the base of the skull through the vertex without intravenous contrast. RADIATION DOSE REDUCTION: This exam was performed according to the departmental dose-optimization program which includes automated exposure control, adjustment of the mA and/or kV according to patient size and/or use of iterative reconstruction technique. COMPARISON:  04/21/2003 FINDINGS: Brain: No evidence of acute infarction, hemorrhage, obstructive hydrocephalus, extra-axial collection or mass lesion/mass effect. Ventriculomegaly primarily attributed to brain atrophy although there is some disproportionate  subarachnoid spaces with crowding at the vertex and callosum angle narrowing. Moderate chronic small vessel ischemia in the cerebral white matter. Vascular: No hyperdense vessel or unexpected calcification. Skull: Normal. Negative for fracture or focal lesion. Sinuses/Orbits: No acute finding. IMPRESSION: 1. No evidence of intracranial injury. 2. Ventriculomegaly with brain atrophy. 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 Electronically Signed   By:  Jorje Guild M.D.   On: 03/24/2022 06:53   DG Chest 1 View  Result Date: 03/24/2022 CLINICAL DATA:  Fall EXAM: CHEST  1 VIEW COMPARISON:  11/10/2018 FINDINGS: Generous heart size and vascular pedicle width accentuated by technique and rotation. Generalized interstitial coarsening is stable. There is no edema, consolidation, effusion, or pneumothorax. No acute fracture detected. IMPRESSION: Low volume chest without acute finding. Electronically Signed   By: Jorje Guild M.D.   On: 03/24/2022 06:26   DG Hips Bilat W or Wo Pelvis 3-4 Views  Result Date: 03/24/2022 CLINICAL DATA:  Fall EXAM: DG HIP (WITH OR WITHOUT PELVIS) 4V BILAT COMPARISON:  None Available. FINDINGS: Impacted left femoral neck fracture, subcapital. Hemiarthroplasty on the right which is unremarkable. Intact appearance of the pelvic ring. Generalized osteopenia. IMPRESSION: Impacted left femoral neck fracture. Electronically Signed   By: Jorje Guild M.D.   On: 03/24/2022 06:26        Scheduled Meds:  acetaminophen  1,000 mg Oral Q6H   aspirin EC  325 mg Oral BID PC   docusate sodium  100 mg Oral BID   feeding supplement  237 mL Oral BID BM   ferrous sulfate  325 mg Oral BID WC   flecainide  100 mg Oral BID   insulin aspart  0-15 Units Subcutaneous TID WC   insulin aspart  0-5 Units Subcutaneous QHS   ketorolac  15 mg Intravenous Once   pantoprazole  40 mg Oral Daily   Continuous Infusions:  [COMPLETED]   ceFAZolin (ANCEF) IV     dextrose 5 % and 0.45% NaCl 75 mL/hr at 03/25/22 0543   methocarbamol (ROBAXIN) IV       LOS: 1 day    Time spent:0 min    Clemente Dewey, Geraldo Docker, MD [facility]   If 7PM-7AM, please contact night-coverage 03/25/2022, 8:07 AM

## 2022-03-25 NOTE — Plan of Care (Signed)
  Problem: Health Behavior/Discharge Planning: Goal: Ability to manage health-related needs will improve Outcome: Progressing   Problem: Clinical Measurements: Goal: Ability to maintain clinical measurements within normal limits will improve Outcome: Progressing Goal: Will remain free from infection Outcome: Progressing Goal: Diagnostic test results will improve Outcome: Progressing Goal: Respiratory complications will improve Outcome: Progressing Goal: Cardiovascular complication will be avoided Outcome: Progressing   Problem: Activity: Goal: Risk for activity intolerance will decrease Outcome: Progressing   Problem: Nutrition: Goal: Adequate nutrition will be maintained Outcome: Progressing   Problem: Coping: Goal: Level of anxiety will decrease Outcome: Progressing   Problem: Elimination: Goal: Will not experience complications related to bowel motility Outcome: Progressing Goal: Will not experience complications related to urinary retention Outcome: Progressing   Problem: Pain Managment: Goal: General experience of comfort will improve Outcome: Progressing   Problem: Safety: Goal: Ability to remain free from injury will improve Outcome: Progressing   Problem: Skin Integrity: Goal: Risk for impaired skin integrity will decrease Outcome: Progressing   Problem: Education: Goal: Verbalization of understanding the information provided (i.e., activity precautions, restrictions, etc) will improve Outcome: Progressing   Problem: Activity: Goal: Ability to ambulate and perform ADLs will improve Outcome: Progressing   Problem: Clinical Measurements: Goal: Postoperative complications will be avoided or minimized Outcome: Progressing   Problem: Self-Concept: Goal: Ability to maintain and perform role responsibilities to the fullest extent possible will improve Outcome: Progressing   Problem: Pain Management: Goal: Pain level will decrease Outcome: Progressing    Problem: Education: Goal: Ability to describe self-care measures that may prevent or decrease complications (Diabetes Survival Skills Education) will improve Outcome: Progressing   Problem: Coping: Goal: Ability to adjust to condition or change in health will improve Outcome: Progressing   Problem: Fluid Volume: Goal: Ability to maintain a balanced intake and output will improve Outcome: Progressing   Problem: Health Behavior/Discharge Planning: Goal: Ability to identify and utilize available resources and services will improve Outcome: Progressing Goal: Ability to manage health-related needs will improve Outcome: Progressing   Problem: Metabolic: Goal: Ability to maintain appropriate glucose levels will improve Outcome: Progressing   Problem: Nutritional: Goal: Maintenance of adequate nutrition will improve Outcome: Progressing Goal: Progress toward achieving an optimal weight will improve Outcome: Progressing   Problem: Skin Integrity: Goal: Risk for impaired skin integrity will decrease Outcome: Progressing   Problem: Tissue Perfusion: Goal: Adequacy of tissue perfusion will improve Outcome: Progressing   Problem: Education: Goal: Knowledge of General Education information will improve Description: Including pain rating scale, medication(s)/side effects and non-pharmacologic comfort measures Outcome: Completed/Met   Problem: Education: Goal: Individualized Educational Video(s) Outcome: Not Applicable   Problem: Education: Goal: Individualized Educational Video(s) Outcome: Not Applicable

## 2022-03-25 NOTE — Progress Notes (Signed)
At bedside for PIV insertion. Pt refused PIV at this time. Informed pt it was needed for IVF and IV medications. Stated, " I want to wait. " Primary RN made aware.

## 2022-03-25 NOTE — Progress Notes (Signed)
Subjective: 1 Day Post-Op Procedure(s) (LRB): LEFT HEMI-ATHROPLASTY (Left) Patient reports pain as 0 on 0-10 scale.    Objective: Vital signs in last 24 hours: Temp:  [97.2 F (36.2 C)-98.8 F (37.1 C)] 97.8 F (36.6 C) (02/20 0538) Pulse Rate:  [65-83] 65 (02/20 0538) Resp:  [13-20] 16 (02/20 0538) BP: (97-137)/(56-74) 125/63 (02/20 0538) SpO2:  [90 %-98 %] 90 % (02/20 0538) Weight:  [86 kg] 86 kg (02/19 1204) WDWN female in NAD resting comfortably in bed. A&Ox3 Skin warm, dry and intact Surgical incision dressed with aquacel.  Appears intact with no bleeding or drainage. Aquacel over skin tears on LLE clean and intact without bleeding or drainage. Pt able to actively elevate her left leg but notes pain while doing so. Pt completed lateral slides without difficulty.  Intake/Output from previous day: 02/19 0701 - 02/20 0700 In: 1545.2 [I.V.:1047.1; IV Piggyback:498.1] Out: 425 [Urine:125; Blood:300] Intake/Output this shift: No intake/output data recorded.  Recent Labs    03/24/22 0550 03/25/22 0526  HGB 15.1* 11.3*   Recent Labs    03/24/22 0550 03/25/22 0526  WBC 19.6* 14.2*  RBC 4.20 3.23*  HCT 41.4 33.5*  PLT 209 157   Recent Labs    03/24/22 0550 03/25/22 0526  NA 137 134*  K 4.0 3.9  CL 100 102  CO2 22 24  BUN 12 20  CREATININE 0.70 0.80  GLUCOSE 139* 202*  CALCIUM 9.7 8.9   No results for input(s): "LABPT", "INR" in the last 72 hours.  Neurologically intact ABD soft Neurovascular intact Sensation intact distally Intact pulses distally Dorsiflexion/Plantar flexion intact Incision: dressing C/D/I and no drainage   Assessment/Plan: 1 Day Post-Op Procedure(s) (LRB): LEFT HEMI-ATHROPLASTY (Left) Patient doing well 1 day status post left hip hemiarthroplasty.  She denies pain symptoms today.  She was resting comfortably in bed and reported no pain symptoms.  She could lift her leg off the bed briefly.  She could slide to the left and right  with no difficulty.   Advance diet Catheter to be removed today Pt to be up with therapy for transfer and gait training. From an orthopedic standpoint, she is doing well. She will remain admitted until able to safely transfer and gait training complete.   [facility] 03/25/2022, 8:41 AM

## 2022-03-25 NOTE — Evaluation (Signed)
Physical Therapy Evaluation Patient Details Name: [name] Dodson MRN: [id number] DOB: Jun 14, 1945 Today's Date: 03/25/2022  History of Present Illness  77 yo female admitted with L hip fx after falling at home. Pt is s/p L hip hemiarthroplasty-anterior approach 03/24/22. Hx of R THA, Afib, chronic pain, sciatica, back pain, dyspnea, lumbar decompression 2018  Clinical Impression  On eval, pt required Mod A for bed mobility and Max A for standing attempts at bedside. Attempted x 5 to stand-pt was unable. Pt reported significant pain in both LEs with activity. Max encouragement from therapist and daughter for participation. Pt seems to have some mild confusion. Both pt and pt's daughter declined pre-medication prior to today's session. Discussion with pt, family, and RN that she may tolerate therapy session better if she is pre-medicated-will give this a try on tomorrow. Discussed d/c plan-pt is hopeful to d/c home with daughter however she will need to be able to safely mobilize with +1 assist. At this time, PT recommendation is for ST SNF rehab.      Recommendations for follow up therapy are one component of a multi-disciplinary discharge planning process, led by the attending physician.  Recommendations may be updated based on patient status, additional functional criteria and insurance authorization.  Follow Up Recommendations Skilled nursing-short term rehab (<3 hours/day) Can patient physically be transported by private vehicle: No    Assistance Recommended at Discharge Frequent or constant Supervision/Assistance  Patient can return home with the following  Assistance with cooking/housework;Assist for transportation;Help with stairs or ramp for entrance;A lot of help with walking and/or transfers;A lot of help with bathing/dressing/bathroom    Equipment Recommendations Rolling walker (2 wheels)  Recommendations for Other Services  OT consult    Functional Status Assessment Patient has had a  recent decline in their functional status and demonstrates the ability to make significant improvements in function in a reasonable and predictable amount of time.     Precautions / Restrictions Precautions Precautions: Fall Restrictions Weight Bearing Restrictions: No Other Position/Activity Restrictions: wbat      Mobility  Bed Mobility Overal bed mobility: Needs Assistance Bed Mobility: Supine to Sit, Sit to Supine     Supine to sit: Mod assist, HOB elevated Sit to supine: Mod assist, HOB elevated   General bed mobility comments: HOB ~45 degrees. Multimodal cues provided. Increased time. Assist for trunk, bil LEs off bed, and to scoot to EOB    Transfers Overall transfer level: Needs assistance Equipment used: Rolling walker (2 wheels) Transfers: Sit to/from Stand Sit to Stand: From elevated surface, Max assist           General transfer comment: 5 attempts for sit to stand-pt only able to partially clear bottom from bed surface. Poor ability to weightbear on either leg. Cues for safety, technique, hand placement. Pt was unable to fully stand.    Ambulation/Gait                  Stairs            Wheelchair Mobility    Modified Rankin (Stroke Patients Only)       Balance Overall balance assessment: Needs assistance Sitting-balance support: Feet supported, Bilateral upper extremity supported Sitting balance-Leahy Scale: Fair                                       Pertinent Vitals/Pain Pain Assessment Pain Assessment: Faces Faces  Pain Scale: Hurts whole lot Pain Location: bil LEs Pain Descriptors / Indicators: Grimacing, Guarding, Sharp Pain Intervention(s): Limited activity within patient's tolerance, Monitored during session, Repositioned, Patient requesting pain meds-RN notified    Home Living Family/patient expects to be discharged to:: Unsure Living Arrangements: Alone Available Help at Discharge: Family Type of Home:  House Home Access: Level entry       Home Layout: One level Home Equipment: Rollator (4 wheels) Additional Comments: info above is daughter's home setup-pt will d/c to daughter's if she does not go to rehab    Prior Function Prior Level of Function : Independent/Modified Independent             Mobility Comments: uses rollator as needed       Hand Dominance        Extremity/Trunk Assessment   Upper Extremity Assessment Upper Extremity Assessment: Defer to OT evaluation    Lower Extremity Assessment Lower Extremity Assessment: Generalized weakness    Cervical / Trunk Assessment Cervical / Trunk Assessment: Normal  Communication   Communication: No difficulties  Cognition Arousal/Alertness: Awake/alert Behavior During Therapy: WFL for tasks assessed/performed Overall Cognitive Status: Impaired/Different from baseline Area of Impairment: Problem solving                             Problem Solving: Requires verbal cues, Requires tactile cues General Comments: requires encouragement        General Comments      Exercises     Assessment/Plan    PT Assessment Patient needs continued PT services  PT Problem List         PT Treatment Interventions DME instruction;Gait training;Therapeutic exercise;Balance training;Therapeutic activities;Patient/family education;Functional mobility training    PT Goals (Current goals can be found in the Care Plan section)  Acute Rehab PT Goals Patient Stated Goal: less pain PT Goal Formulation: With patient/family Time For Goal Achievement: 04/08/22 Potential to Achieve Goals: Good    Frequency Min 3X/week     Co-evaluation               AM-PAC PT "6 Clicks" Mobility  Outcome Measure Help needed turning from your back to your side while in a flat bed without using bedrails?: A Lot Help needed moving from lying on your back to sitting on the side of a flat bed without using bedrails?: A Lot Help  needed moving to and from a bed to a chair (including a wheelchair)?: Total Help needed standing up from a chair using your arms (e.g., wheelchair or bedside chair)?: Total Help needed to walk in hospital room?: Total Help needed climbing 3-5 steps with a railing? : Total 6 Click Score: 8    End of Session Equipment Utilized During Treatment: Gait belt Activity Tolerance: Patient limited by pain Patient left: in bed;with call bell/phone within reach;with bed alarm set;with family/visitor present Nurse Communication: Mobility status;Patient requests pain meds (discussed pre-medicating next session) PT Visit Diagnosis: Pain;Muscle weakness (generalized) (M62.81);Difficulty in walking, not elsewhere classified (R26.2);History of falling (Z91.81) Pain - Right/Left:  (bil) Pain - part of body: Leg    Time: 1111-1145 PT Time Calculation (min) (ACUTE ONLY): 34 min   Charges:   PT Evaluation $PT Eval Moderate Complexity: 1 Mod PT Treatments $Therapeutic Activity: 8-22 mins           Doreatha Massed, PT Acute Rehabilitation  Office: [phone]

## 2022-03-25 NOTE — TOC Initial Note (Signed)
Transition of Care [facility]) - Initial/Assessment Note    Patient Details  Name: [name] Dodson MRN: [id number] Date of Birth: [birth year]  [facility] CM/SW Contact:    Dessa Phi, RN Phone Number: 03/25/2022, 3:00 PM  Clinical Narrative:spoke to dtr [name] Dodson d/c plans home w/[facility] rep following HHPT/OT, may need rw 2 wheels ordered-family to decide in am. Address [phone] Ward Chatters [phone].[facility] HH[facility]/OT.                   Expected Discharge Plan: [place] Barriers to Discharge: Continued Medical Work up   Patient Goals and CMS Choice Patient states their goals for this hospitalization and ongoing recovery are::  (Home) CMS Medicare.gov Compare Post Acute Care list provided to:: Patient Represenative (must comment) Choice offered to / list presented to : Adult Children      Expected Discharge Plan and Services                                              Prior Living Arrangements/Services                       Activities of Daily Living Home Assistive Devices/Equipment: Environmental consultant (specify type), Shower chair with back, Hand-held shower hose, Blood pressure cuff, Eyeglasses (eyeglasses for reading) ADL Screening (condition at time of admission) Patient's cognitive ability adequate to safely complete daily activities?: Yes Is the patient deaf or have difficulty hearing?: No Does the patient have difficulty seeing, even when wearing glasses/contacts?: No Does the patient have difficulty concentrating, remembering, or making decisions?: No Patient able to express need for assistance with ADLs?: Yes Does the patient have difficulty dressing or bathing?: Yes Independently performs ADLs?: No Communication: Independent Dressing (OT): Needs assistance Is this a change from baseline?: Pre-admission baseline Grooming: Needs assistance Is this a change from baseline?: Pre-admission baseline Feeding: Independent Bathing:  Needs assistance Is this a change from baseline?: Pre-admission baseline Toileting: Independent In/Out Bed: Needs assistance Is this a change from baseline?: Pre-admission baseline Walks in Home: Independent with device (comment) Does the patient have difficulty walking or climbing stairs?: Yes Weakness of Legs: Both Weakness of Arms/Hands: Both  Permission Sought/Granted                  Emotional Assessment              Admission diagnosis:  Closed fracture of left hip, initial encounter ([place]) [S72.002A] Closed left hip fracture, initial encounter ([place]) [S72.002A] Status post hip hemiarthroplasty [Z96.649] Patient Active Problem List   Diagnosis Date Noted   Closed left hip fracture, initial encounter ([place]) 03/24/2022   Chronic back pain 03/24/2022   Cerebral ventriculomegaly 03/24/2022   Leukocytosis 03/24/2022   Polycythemia 03/24/2022   Hyperglycemia 03/24/2022   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   Atrial fibrillation, unspecified type ([place]) 11/06/2021   Arthritis 11/06/2021   Gastroesophageal reflux disease 11/06/2021   Osteopenia 11/06/2021   Chronic swimmer's ear of both sides 01/10/2019   PCP:  [name], NP Pharmacy:   [id number] [place], [place] - [address] [id number]: [phone]: [address]#[id number][address] [facility] [address] [place] [address]  Phone: [phone] Fax: [phone]     Social Determinants of Health (SDOH) Social History: SDOH Screenings   Food Insecurity: No Food Insecurity (03/24/2022)  Housing: Low Risk  (03/24/2022)  Transportation Needs: No Transportation Needs (03/24/2022)  Utilities: Not At Risk (03/24/2022)  Depression (PHQ2-9): Medium Risk (11/06/2021)  Tobacco Use: Medium Risk (03/25/2022)   SDOH Interventions:     Readmission Risk Interventions     No  data to display

## 2022-03-26 ENCOUNTER — Inpatient Hospital Stay (HOSPITAL_COMMUNITY): Payer: Medicare Other

## 2022-03-26 DIAGNOSIS — Z96649 Presence of unspecified artificial hip joint: Secondary | ICD-10-CM | POA: Diagnosis not present

## 2022-03-26 DIAGNOSIS — D72829 Elevated white blood cell count, unspecified: Secondary | ICD-10-CM | POA: Diagnosis not present

## 2022-03-26 DIAGNOSIS — I4891 Unspecified atrial fibrillation: Secondary | ICD-10-CM | POA: Diagnosis not present

## 2022-03-26 DIAGNOSIS — K219 Gastro-esophageal reflux disease without esophagitis: Secondary | ICD-10-CM | POA: Diagnosis not present

## 2022-03-26 LAB — URINALYSIS, ROUTINE W REFLEX MICROSCOPIC
Bacteria, UA: NONE SEEN
Bilirubin Urine: NEGATIVE
Glucose, UA: NEGATIVE mg/dL
Hgb urine dipstick: NEGATIVE
Ketones, ur: NEGATIVE mg/dL
Leukocytes,Ua: NEGATIVE
Nitrite: NEGATIVE
Protein, ur: NEGATIVE mg/dL
Specific Gravity, Urine: 1.009 (ref 1.005–1.030)
pH: 5 (ref 5.0–8.0)

## 2022-03-26 LAB — CBC WITH DIFFERENTIAL/PLATELET
Abs Immature Granulocytes: 0.13 10*3/uL — ABNORMAL HIGH (ref 0.00–0.07)
Basophils Absolute: 0.1 10*3/uL (ref 0.0–0.1)
Basophils Relative: 0 %
Eosinophils Absolute: 0.3 10*3/uL (ref 0.0–0.5)
Eosinophils Relative: 2 %
HCT: 33.7 % — ABNORMAL LOW (ref 36.0–46.0)
Hemoglobin: 11.5 g/dL — ABNORMAL LOW (ref 12.0–15.0)
Immature Granulocytes: 1 %
Lymphocytes Relative: 15 %
Lymphs Abs: 1.9 10*3/uL (ref 0.7–4.0)
MCH: 35.1 pg — ABNORMAL HIGH (ref 26.0–34.0)
MCHC: 34.1 g/dL (ref 30.0–36.0)
MCV: 102.7 fL — ABNORMAL HIGH (ref 80.0–100.0)
Monocytes Absolute: 1.1 10*3/uL — ABNORMAL HIGH (ref 0.1–1.0)
Monocytes Relative: 8 %
Neutro Abs: 9.9 10*3/uL — ABNORMAL HIGH (ref 1.7–7.7)
Neutrophils Relative %: 74 %
Platelets: 150 10*3/uL (ref 150–400)
RBC: 3.28 MIL/uL — ABNORMAL LOW (ref 3.87–5.11)
RDW: 13.2 % (ref 11.5–15.5)
WBC: 13.4 10*3/uL — ABNORMAL HIGH (ref 4.0–10.5)
nRBC: 0 % (ref 0.0–0.2)

## 2022-03-26 LAB — GLUCOSE, CAPILLARY
Glucose-Capillary: 125 mg/dL — ABNORMAL HIGH (ref 70–99)
Glucose-Capillary: 150 mg/dL — ABNORMAL HIGH (ref 70–99)
Glucose-Capillary: 121 mg/dL — ABNORMAL HIGH (ref 70–99)
Glucose-Capillary: 103 mg/dL — ABNORMAL HIGH (ref 70–99)

## 2022-03-26 LAB — COMPREHENSIVE METABOLIC PANEL
ALT: 15 U/L (ref 0–44)
AST: 29 U/L (ref 15–41)
Albumin: 3.1 g/dL — ABNORMAL LOW (ref 3.5–5.0)
Alkaline Phosphatase: 41 U/L (ref 38–126)
Anion gap: 8 (ref 5–15)
BUN: 18 mg/dL (ref 8–23)
CO2: 24 mmol/L (ref 22–32)
Calcium: 8.7 mg/dL — ABNORMAL LOW (ref 8.9–10.3)
Chloride: 103 mmol/L (ref 98–111)
Creatinine, Ser: 0.68 mg/dL (ref 0.44–1.00)
GFR, Estimated: >60 mL/min (ref >60)
Glucose, Bld: 114 mg/dL — ABNORMAL HIGH (ref 70–99)
Potassium: 4.6 mmol/L (ref 3.5–5.1)
Sodium: 135 mmol/L (ref 135–145)
Total Bilirubin: 1.1 mg/dL (ref 0.3–1.2)
Total Protein: 5.7 g/dL — ABNORMAL LOW (ref 6.5–8.1)

## 2022-03-26 LAB — MAGNESIUM: Magnesium: 1.9 mg/dL (ref 1.7–2.4)

## 2022-03-26 LAB — HEMOGLOBIN A1C
Hgb A1c MFr Bld: 4.8 % (ref 4.8–5.6)
Mean Plasma Glucose: 91.06 mg/dL

## 2022-03-26 LAB — PHOSPHORUS: Phosphorus: 2.4 mg/dL — ABNORMAL LOW (ref 2.5–4.6)

## 2022-03-26 MED ORDER — POTASSIUM & SODIUM PHOSPHATES 280-160-250 MG PO PACK
1.0000 | PACK | Freq: Three times a day (TID) | ORAL | Status: AC
  Administered 2022-03-26 – 2022-03-27 (×3): 1 via ORAL
  Filled 2022-03-26 (×3): qty 1

## 2022-03-26 NOTE — Progress Notes (Signed)
[name]                                                                               [name] Dodson, is a 77 y.o. female, DOB - Nov 05, 1945, [id number] Admit date - 03/24/2022    Outpatient Primary MD for the patient is [name] Dancer, NP  LOS - 2  days    Brief summary   77 year old WF PMHx A-fib s/p DCCV 9/23 2016, dysarthria, chronic back pain, GERD, presents with left hip pain. X rays showed impacted femoral neck fracture.  Orthopedics consulted and she underwent left hemiarthroplasty.    Assessment & Plan    Assessment and Plan:  Left hip fracture:  S/p left hemiarthroplasty on 2/19.  Pain control and therapy eval recommends SNF. But patient wants to go home.  Patient unable to stand and minimal participation with PT, orthopedics consulted and plan for CT left hip.     Atrial fib , paroxysmal  Rate controlled.  NSR.  Not on anti coagulation.    GERD;  Stable on PPI.   Leukocytosis:  Unclear etiology.  Check UA.  CXR does not show any pneumonia.     Anemia of blood loss from the surgery.  Hemoglobin stable at 11.5.  Macrocytic.  Check vit [id number] and folic acid.    Hypophosphatemia:  Replace. Recheck in am.     RN Pressure Injury Documentation:    Malnutrition Type:  Nutrition Problem: Increased nutrient needs Etiology: post-op healing, hip fracture   Malnutrition Characteristics:  Signs/Symptoms: estimated needs   Nutrition Interventions:  Interventions: Ensure Enlive (each supplement provides 350kcal and 20 grams of protein)  Estimated body mass index is 28.83 kg/m as calculated from the following:   Height as of this encounter: 5' 8"$  (1.727 m).   Weight as of this encounter: 86 kg.  Code Status: full code DVT Prophylaxis:  SCDs Start: 03/24/22 1809 Place TED hose Start: 03/24/22 1809 SCDs Start: 03/24/22 0840   Level of Care: Level of care: Med-Surg Family Communication: family at bedside.   Disposition  Plan:     Remains inpatient appropriate:    Procedures:  Left hemiarthroplasty on 2/19   Consultants:   Orthopedics.   Antimicrobials:   Anti-infectives (From admission, onward)    Start     Dose/Rate Route Frequency Ordered Stop   03/25/22 0600  ceFAZolin (ANCEF) IVPB 2g/100 mL premix        2 g 200 mL/hr over 30 Minutes Intravenous On call to O.R. 03/24/22 1220 03/25/22 0817   03/24/22 2000  ceFAZolin (ANCEF) IVPB 2g/100 mL premix        2 g 200 mL/hr over 30 Minutes Intravenous Every 6 hours 03/24/22 1808 03/25/22 1144   03/24/22 1224  ceFAZolin (ANCEF) 2-4 GM/100ML-% IVPB       Note to Pharmacy: Lysle Pearl: cabinet override      03/24/22 1224 03/24/22 1335        Medications  Scheduled Meds:  aspirin EC  325 mg Oral BID PC   docusate sodium  100 mg Oral BID   feeding supplement  237 mL Oral BID BM   ferrous sulfate  325  mg Oral BID WC   flecainide  100 mg Oral BID   insulin aspart  0-15 Units Subcutaneous TID WC   insulin aspart  0-5 Units Subcutaneous QHS   ketorolac  15 mg Intravenous Once   pantoprazole  40 mg Oral Daily   Continuous Infusions:  dextrose 5 % and 0.45% NaCl Stopped (03/25/22 1636)   methocarbamol (ROBAXIN) IV     PRN Meds:.acetaminophen, alum & mag hydroxide-simeth, bisacodyl, calcium carbonate, HYDROmorphone (DILAUDID) injection, magnesium citrate, menthol-cetylpyridinium **OR** phenol, methocarbamol **OR** methocarbamol (ROBAXIN) IV, metoCLOPramide **OR** metoCLOPramide (REGLAN) injection, ondansetron **OR** ondansetron (ZOFRAN) IV, oxyCODONE, oxyCODONE, senna-docusate, zolpidem    Subjective:   [name] Dodson was seen and examined today.  Painful ROM of th left leg. No new complaints.   Objective:   Vitals:   03/25/22 0538 03/25/22 1227 03/25/22 2012 03/26/22 0604  BP: 125/63 136/61 (!) 131/54 (!) 148/69  Pulse: 65 68 78 78  Resp: 16 18 (!) 22 20  Temp: 97.8 F (36.6 C) 98.5 F (36.9 C) 98 F (36.7 C) 99.3 F (37.4 C)   TempSrc: Oral Oral Oral Oral  SpO2: 90% 96% 92% 90%  Weight:      Height:        Intake/Output Summary (Last 24 hours) at 03/26/2022 1155 Last data filed at 03/26/2022 [id number] Gross per 24 hour  Intake 856.18 ml  Output 1600 ml  Net -743.82 ml   Filed Weights   03/24/22 0544 03/24/22 1204  Weight: 86.2 kg 86 kg     Exam General: Alert and oriented x 3, NAD Cardiovascular: S1 S2 auscultated, no murmurs, RRR Respiratory: Clear to auscultation bilaterally, no wheezing, rales or rhonchi Gastrointestinal: Soft, nontender, nondistended, + bowel sounds Ext: left hip painful ROM. Neuro: AAOx3, Grossly non focal.  Skin: No rashes Psych: Normal affect and demeanor, alert and oriented x3    Data Reviewed:  I have personally reviewed following labs and imaging studies   CBC Lab Results  Component Value Date   WBC 13.4 (H) 03/26/2022   RBC 3.28 (L) 03/26/2022   HGB 11.5 (L) 03/26/2022   HCT 33.7 (L) 03/26/2022   MCV 102.7 (H) 03/26/2022   MCH 35.1 (H) 03/26/2022   PLT 150 03/26/2022   MCHC 34.1 03/26/2022   RDW 13.2 03/26/2022   LYMPHSABS 1.9 03/26/2022   MONOABS 1.1 (H) 03/26/2022   EOSABS 0.3 03/26/2022   BASOSABS 0.1 [id number]     Last metabolic panel Lab Results  Component Value Date   NA 135 03/26/2022   K 4.6 03/26/2022   CL 103 03/26/2022   CO2 24 03/26/2022   BUN 18 03/26/2022   CREATININE 0.68 03/26/2022   GLUCOSE 114 (H) 03/26/2022   GFRNONAA >60 03/26/2022   GFRAA >60 06/06/2016   CALCIUM 8.7 (L) 03/26/2022   PHOS 2.4 (L) 03/26/2022   PROT 5.7 (L) 03/26/2022   ALBUMIN 3.1 (L) 03/26/2022   BILITOT 1.1 03/26/2022   ALKPHOS 41 03/26/2022   AST 29 03/26/2022   ALT 15 03/26/2022   ANIONGAP 8 03/26/2022    CBG (last 3)  Recent Labs    03/25/22 2006 03/26/22 0732 03/26/22 1134  GLUCAP 146* 125* 150*      Coagulation Profile: No results for input(s): "INR", "PROTIME" in the last 168 hours.   Radiology Studies: Pelvis Portable  Result  Date: 03/24/2022 CLINICAL DATA:  Status post left total hip arthroplasty EXAM: PORTABLE PELVIS 1-2 VIEWS COMPARISON:  03/24/2022, 5:50 a.m. FINDINGS: Patient has undergone a previous right hip  arthroplasty and there has been an interval left hip arthroplasty with grossly anatomic alignment. IMPRESSION: Status post bilateral hip arthroplasty. Electronically Signed   By: Sammie Bench M.D.   On: 03/24/2022 16:47   DG HIP UNILAT WITH PELVIS 1V LEFT  Result Date: 03/24/2022 CLINICAL DATA:  Hip replacement EXAM: DG HIP (WITH OR WITHOUT PELVIS) 1V*L* COMPARISON:  03/24/2022 FINDINGS: Two low resolution intraoperative spot views of the left hip. Total fluoroscopy time 6 seconds, fluoroscopic dose of 0.9362 mGy. Images demonstrate previous right hip replacement. There is interval left hip replacement with normal alignment. IMPRESSION: Intraoperative fluoroscopic assistance provided during left hip replacement. Electronically Signed   By: Donavan Foil M.D.   On: 03/24/2022 15:29   DG C-Arm 1-60 Min-No Report  Result Date: 03/24/2022 Fluoroscopy was utilized by the requesting physician.  No radiographic interpretation.   DG C-Arm 1-60 Min-No Report  Result Date: 03/24/2022 Fluoroscopy was utilized by the requesting physician.  No radiographic interpretation.       Hosie Poisson M.D. [name] 03/26/2022, 11:55 AM  Available via Epic secure chat 7am-7pm After 7 pm, please refer to night coverage provider listed on amion.

## 2022-03-26 NOTE — Progress Notes (Addendum)
Subjective: 2 Days Post-Op Procedure(s) (LRB): LEFT HEMI-ATHROPLASTY (Left) Patient reports pain as moderate.  States that she has pain throughout the leg and has had difficulty standing and taking any steps.  Objective: Vital signs in last 24 hours: Temp:  [98 F (36.7 C)-99.3 F (37.4 C)] 98.9 F (37.2 C) (02/21 1221) Pulse Rate:  [77-78] 77 (02/21 1221) Resp:  [18-22] 18 (02/21 1221) BP: (105-148)/(54-69) 105/59 (02/21 1221) SpO2:  [90 %-92 %] 91 % (02/21 1221)  Intake/Output from previous day: 02/20 0701 - 02/21 0700 In: 2664.4 [P.O.:940; I.V.:1624.4; IV Piggyback:100] Out: 1600 [Urine:1600] Intake/Output this shift: Total I/O In: 120 [P.O.:120] Out: -   Recent Labs    03/24/22 0550 03/25/22 0526 03/26/22 0459  HGB 15.1* 11.3* 11.5*   Recent Labs    03/25/22 0526 03/26/22 0459  WBC 14.2* 13.4*  RBC 3.23* 3.28*  HCT 33.5* 33.7*  PLT 157 150   Recent Labs    03/25/22 0526 03/26/22 0459  NA 134* 135  K 3.9 4.6  CL 102 103  CO2 24 24  BUN 20 18  CREATININE 0.80 0.68  GLUCOSE 202* 114*  CALCIUM 8.9 8.7*   No results for input(s): "LABPT", "INR" in the last 72 hours.  Neurologically intact ABD soft Neurovascular intact Sensation intact distally Intact pulses distally Dorsiflexion/Plantar flexion intact No cellulitis present Compartment soft Patient has pain with range of motion to leg.  She has positive pain with heel strike.   Assessment/Plan: 2 Days Post-Op Procedure(s) (LRB): LEFT HEMI-ATHROPLASTY (Left) Advance diet Up with therapy The patient continues to have significant complaints of pain and inability to stand or walk I think we should do a CAT scan of the left hip prior to discharge.  She is so severely osteoporotic I would want to make certain that there is not some proximal femur fracture around the implant.  If she begins to stand and walk I do not think we will need to do it. Patient seems to be progressing slowly and may need  discharge to skilled nursing facility.  Was up on floor watching the patient attempt to stand and walk.  She is really struggling to do so.  I think it is important to get a CAT scan of her hip just to make sure there is not some periprosthetic fracture or something that we cannot see on plain film if you are going to be pushing her to stand and walk.  Will get that and I will evaluate that and we will see her tomorrow.   Alta Corning 03/26/2022, 12:50 PM

## 2022-03-26 NOTE — Progress Notes (Signed)
Physical Therapy Treatment Patient Details Name: [name] Dodson MRN: [id number] DOB: 12/26/1945 Today's Date: 03/26/2022   History of Present Illness 77 yo female admitted with L hip fx after falling at home. Pt is s/p L hip hemiarthroplasty-anterior approach 03/24/22. Hx of R THA, Afib, chronic pain, sciatica, back pain, dyspnea, lumbar decompression 2018    PT Comments    POD # 2 pm session Pt tolerated sitting in recliner 1.5 hours.  C/o increased low back pain.  Attempted traditional sit to stand however pt was unable to clear hips off recliner despite three attempts with + 3 ASSIST.  Had to use STEDY to transfer from recliner back to bed.  Pt required Total Assist to transfer from EOB to supine placing pillows for comfort and ICE to left hip. Dr Berenice Primas present and observed.  LPT has rec SNF however family is hoping to take her home.  Will continue to follow.   Recommendations for follow up therapy are one component of a multi-disciplinary discharge planning process, led by the attending physician.  Recommendations may be updated based on patient status, additional functional criteria and insurance authorization.  Follow Up Recommendations  Skilled nursing-short term rehab (<3 hours/day) Can patient physically be transported by private vehicle: No   Assistance Recommended at Discharge Frequent or constant Supervision/Assistance  Patient can return home with the following Assistance with cooking/housework;Assist for transportation;Help with stairs or ramp for entrance;A lot of help with walking and/or transfers;A lot of help with bathing/dressing/bathroom   Equipment Recommendations  Rolling walker (2 wheels)    Recommendations for Other Services       Precautions / Restrictions Precautions Precautions: Fall Restrictions Weight Bearing Restrictions: No Other Position/Activity Restrictions: WBAT     Mobility  Bed Mobility Overal bed mobility: Needs Assistance Bed Mobility: Sit  to Supine     Supine to sit: +2 for physical assistance, +2 for safety/equipment, Total assist     General bed mobility comments: pt required Total Asisst + 2 back to bed due to pain.  Positioned with pillows to comfort and applied ICE.    Transfers Overall transfer level: Needs assistance Equipment used: Rolling walker (2 wheels) Transfers: Sit to/from Stand Sit to Stand: +2 physical assistance, +2 safety/equipment, Max assist, Total assist           General transfer comment: attempted sit to stand + 2 asisst with walker was unsuccessful due to increased fear/anxiety/pain.  Used STEDY with + 3 asisst to transfer from recliner to  bed.  Attempted standing in STEDY however pt unable to achieve upright posture.    Ambulation/Gait               General Gait Details: pt unable to tolerate standing due to B hip pain as well as c/o low back pain.   Stairs             Wheelchair Mobility    Modified Rankin (Stroke Patients Only)       Balance                                            Cognition Arousal/Alertness: Awake/alert Behavior During Therapy: WFL for tasks assessed/performed Overall Cognitive Status: Within Functional Limits for tasks assessed  General Comments: AxO x 3 very pleasant Lady.  Required increased encouragement and time as she presents with anxiety, fear of falling and low pain tolerance.        Exercises      General Comments        Pertinent Vitals/Pain Pain Assessment Pain Assessment: 0-10 Pain Score: 8  Pain Location: B hips Pain Descriptors / Indicators: Grimacing, Guarding, Sharp, Aching, Discomfort Pain Intervention(s): Monitored during session, Premedicated before session, Repositioned, Ice applied    Home Living                          Prior Function            PT Goals (current goals can now be found in the care plan section) Progress  towards PT goals: Progressing toward goals    Frequency    Min 3X/week      PT Plan Current plan remains appropriate    Co-evaluation              AM-PAC PT "6 Clicks" Mobility   Outcome Measure  Help needed turning from your back to your side while in a flat bed without using bedrails?: A Lot Help needed moving from lying on your back to sitting on the side of a flat bed without using bedrails?: A Lot Help needed moving to and from a bed to a chair (including a wheelchair)?: Total Help needed standing up from a chair using your arms (e.g., wheelchair or bedside chair)?: Total Help needed to walk in hospital room?: Total Help needed climbing 3-5 steps with a railing? : Total 6 Click Score: 8    End of Session Equipment Utilized During Treatment: Gait belt Activity Tolerance: Patient limited by pain Patient left: in bed;with bed alarm set;with call bell/phone within reach;with family/visitor present Nurse Communication: Mobility status PT Visit Diagnosis: Pain;Muscle weakness (generalized) (M62.81);Difficulty in walking, not elsewhere classified (R26.2);History of falling (Z91.81) Pain - Right/Left: Left Pain - part of body: Leg     Time: [id number] PT Time Calculation (min) (ACUTE ONLY): 17 min  Charges:  $Therapeutic Exercise: 8-22 mins $Therapeutic Activity: 8-22 mins                    Rica Koyanagi  PTA Acute  Rehabilitation Services Office M-F          [phone] Weekend pager [phone]

## 2022-03-26 NOTE — Progress Notes (Signed)
   03/26/22 1517  Provider Notification  Provider Name/Title Dr. Karleen Hampshire  Date Provider Notified 03/26/22  Time Provider Notified 1517  Method of Notification Page  Notification Reason Other (Comment) (Lost IV)  Provider response See new orders;Other (Comment) (gave order to leave IV out)  Date of Provider Response 03/26/22  Time of Provider Response 1517

## 2022-03-26 NOTE — Plan of Care (Signed)
  Problem: Clinical Measurements: Goal: Ability to maintain clinical measurements within normal limits will improve Outcome: Progressing Goal: Will remain free from infection Outcome: Progressing Goal: Diagnostic test results will improve Outcome: Progressing Goal: Respiratory complications will improve Outcome: Progressing Goal: Cardiovascular complication will be avoided Outcome: Progressing   Problem: Nutrition: Goal: Adequate nutrition will be maintained Outcome: Progressing   Problem: Coping: Goal: Level of anxiety will decrease Outcome: Progressing   Problem: Elimination: Goal: Will not experience complications related to bowel motility Outcome: Progressing   Problem: Pain Managment: Goal: General experience of comfort will improve Outcome: Progressing   Problem: Safety: Goal: Ability to remain free from injury will improve Outcome: Progressing   Problem: Skin Integrity: Goal: Risk for impaired skin integrity will decrease Outcome: Progressing   Problem: Education: Goal: Verbalization of understanding the information provided (i.e., activity precautions, restrictions, etc) will improve Outcome: Progressing   Problem: Clinical Measurements: Goal: Postoperative complications will be avoided or minimized Outcome: Progressing   Problem: Pain Management: Goal: Pain level will decrease Outcome: Progressing   Problem: Education: Goal: Ability to describe self-care measures that may prevent or decrease complications (Diabetes Survival Skills Education) will improve Outcome: Progressing   Problem: Coping: Goal: Ability to adjust to condition or change in health will improve Outcome: Progressing   Problem: Health Behavior/Discharge Planning: Goal: Ability to identify and utilize available resources and services will improve Outcome: Progressing Goal: Ability to manage health-related needs will improve Outcome: Progressing   Problem: Metabolic: Goal: Ability  to maintain appropriate glucose levels will improve Outcome: Progressing   Problem: Nutritional: Goal: Maintenance of adequate nutrition will improve Outcome: Progressing Goal: Progress toward achieving an optimal weight will improve Outcome: Progressing   Problem: Skin Integrity: Goal: Risk for impaired skin integrity will decrease Outcome: Progressing   Problem: Tissue Perfusion: Goal: Adequacy of tissue perfusion will improve Outcome: Progressing   Problem: Activity: Goal: Risk for activity intolerance will decrease Outcome: Not Progressing   Problem: Activity: Goal: Ability to ambulate and perform ADLs will improve Outcome: Not Progressing   Problem: Self-Concept: Goal: Ability to maintain and perform role responsibilities to the fullest extent possible will improve Outcome: Not Progressing   Problem: Elimination: Goal: Will not experience complications related to urinary retention Outcome: Completed/Met   Problem: Fluid Volume: Goal: Ability to maintain a balanced intake and output will improve Outcome: Completed/Met   Problem: Education: Goal: Individualized Educational Video(s) Outcome: Not Applicable

## 2022-03-26 NOTE — Progress Notes (Signed)
OT Cancellation Note  Patient Details Name: [name] Dodson MRN: [id number] DOB: July 15, 1945   Cancelled Treatment:    Reason Eval/Treat Not Completed: Fatigue/lethargy limiting ability to participate Patient is currently sleeping in bed with nurse asking to let patient be. OT to continue to follow and check as schedule will allow.  Rennie Plowman, [place] Acute Rehabilitation Department Office# [phone]  03/26/2022, 2:02 PM

## 2022-03-26 NOTE — Progress Notes (Signed)
Physical Therapy Treatment Patient Details Name: [name] Dodson MRN: [id number] DOB: 07-28-1945 Today's Date: 03/26/2022   History of Present Illness 77 yo female admitted with L hip fx after falling at home. Pt is s/p L hip hemiarthroplasty-anterior approach 03/24/22. Hx of R THA, Afib, chronic pain, sciatica, back pain, dyspnea, lumbar decompression 2018    PT Comments    POD # 2 Pt was premedicated 5 mg Oxy.  2 family members in room at time.  Very helpful. Performed TE's hell slides B LE prior to activity.  Educated on use of strap to self perform.  Required increased time to complete 10 reps due to pain.   Assisted OOB was extremely difficult.  General bed mobility comments: attempted to instruct pt how to use a belt to self assist LE but limited by pain/anxiety.  Used bed pad to complete scooting to EOB.  Process required increasewd time.  Nearly 8 min to complete.  Once EOB, pt required Min Asisst for balance (posterior lean).  Increased anxiety as well as fear. General transfer comment: attempted sit to stand + 2 asisst with walker was unsuccessful due to increased fear/anxiety/pain.  Used STEDY with + 3 asisst to transfer fro54melevated bed to recliner. Pt c/o increased pain.  Alerted RN for Tylenol and Robaxxin.  Applied ICE to L hip. Will return later to assist pt back to bed. Per chart review, family would like to take pt home.   Recommendations for follow up therapy are one component of a multi-disciplinary discharge planning process, led by the attending physician.  Recommendations may be updated based on patient status, additional functional criteria and insurance authorization.  Follow Up Recommendations  Skilled nursing-short term rehab (<3 hours/day) Can patient physically be transported by private vehicle: No   Assistance Recommended at Discharge Frequent or constant Supervision/Assistance  Patient can return home with the following Assistance with cooking/housework;Assist for  transportation;Help with stairs or ramp for entrance;A lot of help with walking and/or transfers;A lot of help with bathing/dressing/bathroom   Equipment Recommendations  Rolling walker (2 wheels)    Recommendations for Other Services       Precautions / Restrictions Precautions Precautions: Fall Restrictions Weight Bearing Restrictions: No Other Position/Activity Restrictions: WBAT     Mobility  Bed Mobility Overal bed mobility: Needs Assistance Bed Mobility: Supine to Sit     Supine to sit: Max assist, +2 for physical assistance, +2 for safety/equipment     General bed mobility comments: attempted to instruct pt how to use a belt to self assist LE but limited by pain/anxiety.  Used bed pad to complete scooting to EOB.  Process required increasewd time.  Nearly 8 min to complete.  Once EOB, pt required Min Asisst for balance (posterior lean).  Increased anxiety as well as fear.    Transfers Overall transfer level: Needs assistance   Transfers: Sit to/from Stand             General transfer comment: attempted sit to stand + 2 asisst with walker was unsuccessful due to increased fear/anxiety/pain.  Used STEDY with + 3 asisst to transfer [id number] bed to recliner.    Ambulation/Gait               General Gait Details: pt unable to tolerate standing   Stairs             Wheelchair Mobility    Modified Rankin (Stroke Patients Only)       Balance  Cognition Arousal/Alertness: Awake/alert Behavior During Therapy: WFL for tasks assessed/performed Overall Cognitive Status: Within Functional Limits for tasks assessed                                 General Comments: AxO x 3 very pleasant Lady.  Required increased encouragement and time as she presents with anxiety, fear of falling and low pain tolerance.        Exercises  10 reps B heel slides using strap     General Comments        Pertinent Vitals/Pain Pain Assessment Pain Assessment: 0-10 Pain Score: 8  Pain Location: B hips Pain Descriptors / Indicators: Grimacing, Guarding, Sharp, Aching, Discomfort Pain Intervention(s): Monitored during session, Premedicated before session, Repositioned, Ice applied    Home Living                          Prior Function            PT Goals (current goals can now be found in the care plan section) Progress towards PT goals: Progressing toward goals    Frequency    Min 3X/week      PT Plan Current plan remains appropriate    Co-evaluation              AM-PAC PT "6 Clicks" Mobility   Outcome Measure  Help needed turning from your back to your side while in a flat bed without using bedrails?: A Lot Help needed moving from lying on your back to sitting on the side of a flat bed without using bedrails?: A Lot Help needed moving to and from a bed to a chair (including a wheelchair)?: Total Help needed standing up from a chair using your arms (e.g., wheelchair or bedside chair)?: Total Help needed to walk in hospital room?: Total Help needed climbing 3-5 steps with a railing? : Total 6 Click Score: 8    End of Session Equipment Utilized During Treatment: Gait belt Activity Tolerance: Patient limited by pain;Other (comment) (limited by pain/anxiety/fear) Patient left: in chair;with call bell/phone within reach;with family/visitor present Nurse Communication: Mobility status PT Visit Diagnosis: Pain;Muscle weakness (generalized) (M62.81);Difficulty in walking, not elsewhere classified (R26.2);History of falling (Z91.81) Pain - Right/Left: Left Pain - part of body: Leg     Time: 1020-1059 PT Time Calculation (min) (ACUTE ONLY): 39 min  Charges:  $Therapeutic Exercise: 8-22 mins $Therapeutic Activity: 23-37 mins                    [name] Dodson  PTA Acute  Rehabilitation Services Office M-F           [phone] Weekend pager [phone]

## 2022-03-27 ENCOUNTER — Inpatient Hospital Stay (HOSPITAL_COMMUNITY): Payer: Medicare Other

## 2022-03-27 DIAGNOSIS — K219 Gastro-esophageal reflux disease without esophagitis: Secondary | ICD-10-CM | POA: Diagnosis not present

## 2022-03-27 DIAGNOSIS — D72829 Elevated white blood cell count, unspecified: Secondary | ICD-10-CM | POA: Diagnosis not present

## 2022-03-27 DIAGNOSIS — Z96649 Presence of unspecified artificial hip joint: Secondary | ICD-10-CM | POA: Diagnosis not present

## 2022-03-27 DIAGNOSIS — I4891 Unspecified atrial fibrillation: Secondary | ICD-10-CM | POA: Diagnosis not present

## 2022-03-27 LAB — COMPREHENSIVE METABOLIC PANEL
ALT: 15 U/L (ref 0–44)
AST: 28 U/L (ref 15–41)
Albumin: 3 g/dL — ABNORMAL LOW (ref 3.5–5.0)
Alkaline Phosphatase: 42 U/L (ref 38–126)
Anion gap: 9 (ref 5–15)
BUN: 18 mg/dL (ref 8–23)
CO2: 26 mmol/L (ref 22–32)
Calcium: 8.5 mg/dL — ABNORMAL LOW (ref 8.9–10.3)
Chloride: 101 mmol/L (ref 98–111)
Creatinine, Ser: 0.63 mg/dL (ref 0.44–1.00)
GFR, Estimated: >60 mL/min (ref >60)
Glucose, Bld: 125 mg/dL — ABNORMAL HIGH (ref 70–99)
Potassium: 3.9 mmol/L (ref 3.5–5.1)
Sodium: 136 mmol/L (ref 135–145)
Total Bilirubin: 1.1 mg/dL (ref 0.3–1.2)
Total Protein: 5.6 g/dL — ABNORMAL LOW (ref 6.5–8.1)

## 2022-03-27 LAB — CBC WITH DIFFERENTIAL/PLATELET
Abs Immature Granulocytes: 0.1 10*3/uL — ABNORMAL HIGH (ref 0.00–0.07)
Basophils Absolute: 0.1 10*3/uL (ref 0.0–0.1)
Basophils Relative: 1 %
Eosinophils Absolute: 0.6 10*3/uL — ABNORMAL HIGH (ref 0.0–0.5)
Eosinophils Relative: 6 %
HCT: 30.3 % — ABNORMAL LOW (ref 36.0–46.0)
Hemoglobin: 10.6 g/dL — ABNORMAL LOW (ref 12.0–15.0)
Immature Granulocytes: 1 %
Lymphocytes Relative: 16 %
Lymphs Abs: 1.7 10*3/uL (ref 0.7–4.0)
MCH: 35.5 pg — ABNORMAL HIGH (ref 26.0–34.0)
MCHC: 35 g/dL (ref 30.0–36.0)
MCV: 101.3 fL — ABNORMAL HIGH (ref 80.0–100.0)
Monocytes Absolute: 0.9 10*3/uL (ref 0.1–1.0)
Monocytes Relative: 8 %
Neutro Abs: 7.7 10*3/uL (ref 1.7–7.7)
Neutrophils Relative %: 68 %
Platelets: 143 10*3/uL — ABNORMAL LOW (ref 150–400)
RBC: 2.99 MIL/uL — ABNORMAL LOW (ref 3.87–5.11)
RDW: 13.1 % (ref 11.5–15.5)
WBC: 11.2 10*3/uL — ABNORMAL HIGH (ref 4.0–10.5)
nRBC: 0 % (ref 0.0–0.2)

## 2022-03-27 LAB — GLUCOSE, CAPILLARY
Glucose-Capillary: 115 mg/dL — ABNORMAL HIGH (ref 70–99)
Glucose-Capillary: 137 mg/dL — ABNORMAL HIGH (ref 70–99)
Glucose-Capillary: 110 mg/dL — ABNORMAL HIGH (ref 70–99)
Glucose-Capillary: 102 mg/dL — ABNORMAL HIGH (ref 70–99)

## 2022-03-27 LAB — VITAMIN B12: Vitamin B-12: 209 pg/mL (ref 180–914)

## 2022-03-27 LAB — FOLATE: Folate: 5.3 ng/mL — ABNORMAL LOW (ref >5.9)

## 2022-03-27 LAB — PHOSPHORUS: Phosphorus: 3.6 mg/dL (ref 2.5–4.6)

## 2022-03-27 LAB — MAGNESIUM: Magnesium: 2.3 mg/dL (ref 1.7–2.4)

## 2022-03-27 MED ORDER — FOLIC ACID 1 MG PO TABS
1.0000 mg | ORAL_TABLET | Freq: Every day | ORAL | Status: DC
  Administered 2022-03-27 – 2022-03-31 (×5): 1 mg via ORAL
  Filled 2022-03-27 (×5): qty 1

## 2022-03-27 MED ORDER — SENNOSIDES-DOCUSATE SODIUM 8.6-50 MG PO TABS
2.0000 | ORAL_TABLET | Freq: Two times a day (BID) | ORAL | Status: DC
  Administered 2022-03-27 – 2022-03-31 (×9): 2 via ORAL
  Filled 2022-03-27 (×9): qty 2

## 2022-03-27 MED ORDER — VITAMIN B-12 1000 MCG PO TABS
1000.0000 ug | ORAL_TABLET | Freq: Every day | ORAL | Status: DC
  Administered 2022-03-27 – 2022-03-31 (×5): 1000 ug via ORAL
  Filled 2022-03-27 (×5): qty 1

## 2022-03-27 MED ORDER — POLYETHYLENE GLYCOL 3350 17 G PO PACK
17.0000 g | PACK | Freq: Every day | ORAL | Status: DC
  Administered 2022-03-27 – 2022-03-30 (×4): 17 g via ORAL
  Filled 2022-03-27 (×5): qty 1

## 2022-03-27 NOTE — Progress Notes (Signed)
[name]                                                                               [name] Dodson, is a 77 y.o. female, DOB - [birth year], [id number] Admit date - 03/24/2022    Outpatient Primary MD for the patient is [name], NP  LOS - 3  days    Brief summary   77 year old WF PMHx A-fib s/p DCCV 9/23 2016, dysarthria, chronic back pain, GERD, presents with left hip pain. X rays showed impacted femoral neck fracture.  Orthopedics consulted and she underwent left hemiarthroplasty.    Assessment & Plan    Assessment and Plan:  Left hip fracture:  S/p left hemiarthroplasty on 2/19.  Pain control and therapy eval recommends SNF. But patient wants to go home.  Patient unable to stand and minimal participation with PT, orthopedics consulted and plan for CT left hip.  CT hip does not show any periprosthetic hip fracture.  Right hip pain with PT. X rays of the right hip ordered.    Atrial fib , paroxysmal  Rate controlled.  NSR.  Not on anti coagulation.    GERD;  Stable on PPI.   Leukocytosis:  Unclear etiology, probably reactive. But improving. Marland Kitchen  UA is negative.  CXR does not show any pneumonia.     Anemia of blood loss from the surgery.  Hemoglobin stable at 11.5.  Macrocytic.  Low normal vit [id number] and low folic acid levels. Replacements ordered.    Hypophosphatemia:  Replaced.     RN Pressure Injury Documentation:    Malnutrition Type:  Nutrition Problem: Increased nutrient needs Etiology: post-op healing, hip fracture   Malnutrition Characteristics:  Signs/Symptoms: estimated needs   Nutrition Interventions:  Interventions: Ensure Enlive (each supplement provides 350kcal and 20 grams of protein)  Estimated body mass index is 28.83 kg/m as calculated from the following:   Height as of this encounter: 5' 8"$  (1.727 m).   Weight as of this encounter: 86 kg.  Code Status: full code DVT Prophylaxis:  SCDs Start:  03/24/22 1809 Place TED hose Start: 03/24/22 1809 SCDs Start: 03/24/22 0840   Level of Care: Level of care: Med-Surg Family Communication: family at bedside.   Disposition Plan:     Remains inpatient appropriate:  discharge when able to tolerate pain.   Procedures:  Left hemiarthroplasty on 2/19   Consultants:   Orthopedics.   Antimicrobials:   Anti-infectives (From admission, onward)    Start     Dose/Rate Route Frequency Ordered Stop   03/25/22 0600  ceFAZolin (ANCEF) IVPB 2g/100 mL premix        2 g 200 mL/hr over 30 Minutes Intravenous On call to O.R. 03/24/22 1220 03/25/22 0817   03/24/22 2000  ceFAZolin (ANCEF) IVPB 2g/100 mL premix        2 g 200 mL/hr over 30 Minutes Intravenous Every 6 hours 03/24/22 1808 03/25/22 1144   03/24/22 1224  ceFAZolin (ANCEF) 2-4 GM/100ML-% IVPB       Note to Pharmacy: Lysle Pearl: cabinet override      03/24/22 1224 03/24/22 1335        Medications  Scheduled Meds:  aspirin EC  325 mg Oral BID PC   vitamin B-12  1,000 mcg Oral Daily   docusate sodium  100 mg Oral BID   feeding supplement  237 mL Oral BID BM   ferrous sulfate  325 mg Oral BID WC   flecainide  100 mg Oral BID   folic acid  1 mg Oral Daily   insulin aspart  0-15 Units Subcutaneous TID WC   insulin aspart  0-5 Units Subcutaneous QHS   ketorolac  15 mg Intravenous Once   pantoprazole  40 mg Oral Daily   polyethylene glycol  17 g Oral Daily   senna-docusate  2 tablet Oral BID   Continuous Infusions:  dextrose 5 % and 0.45% NaCl Stopped (03/25/22 1636)   methocarbamol (ROBAXIN) IV     PRN Meds:.acetaminophen, alum & mag hydroxide-simeth, bisacodyl, calcium carbonate, magnesium citrate, menthol-cetylpyridinium **OR** phenol, methocarbamol **OR** methocarbamol (ROBAXIN) IV, metoCLOPramide **OR** metoCLOPramide (REGLAN) injection, ondansetron **OR** ondansetron (ZOFRAN) IV, oxyCODONE, oxyCODONE, zolpidem    Subjective:   [name] Dodson was seen and examined  today.  Pain has improved in the left. Right hip pain today. No chest pain or sob. No BM yet. Bowel regimen ordered.   Objective:   Vitals:   03/26/22 2212 03/26/22 2213 03/27/22 0438 03/27/22 1157  BP: (!) 149/67  (!) 126/59 116/65  Pulse: 87 71 91 71  Resp: 18  18 18  $ Temp: 98.5 F (36.9 C)  99.7 F (37.6 C) 97.8 F (36.6 C)  TempSrc: Oral  Oral Oral  SpO2: 91% 95% 90% 90%  Weight:      Height:        Intake/Output Summary (Last 24 hours) at 03/27/2022 1247 Last data filed at 03/27/2022 0443 Gross per 24 hour  Intake 240 ml  Output 580 ml  Net -340 ml    Filed Weights   03/24/22 0544 03/24/22 1204  Weight: 86.2 kg 86 kg     Exam General exam: Appears calm and comfortable  Respiratory system: Clear to auscultation. Respiratory effort normal. Cardiovascular system: S1 & S2 heard, RRR. No JVD,  Gastrointestinal system: Abdomen is nondistended, soft and nontender.  Central nervous system: Alert and oriented. No focal neurological deficits. Extremities: left hip incision site bandaged.  Skin: No rashes,  Psychiatry:Mood & affect appropriate.    Data Reviewed:  I have personally reviewed following labs and imaging studies   CBC Lab Results  Component Value Date   WBC 11.2 (H) 03/27/2022   RBC 2.99 (L) 03/27/2022   HGB 10.6 (L) 03/27/2022   HCT 30.3 (L) 03/27/2022   MCV 101.3 (H) 03/27/2022   MCH 35.5 (H) 03/27/2022   PLT 143 (L) 03/27/2022   MCHC 35.0 03/27/2022   RDW 13.1 03/27/2022   LYMPHSABS 1.7 03/27/2022   MONOABS 0.9 03/27/2022   EOSABS 0.6 (H) 03/27/2022   BASOSABS 0.1 [id number]     Last metabolic panel Lab Results  Component Value Date   NA 136 03/27/2022   K 3.9 03/27/2022   CL 101 03/27/2022   CO2 26 03/27/2022   BUN 18 03/27/2022   CREATININE 0.63 03/27/2022   GLUCOSE 125 (H) 03/27/2022   GFRNONAA >60 03/27/2022   GFRAA >60 06/06/2016   CALCIUM 8.5 (L) 03/27/2022   PHOS 3.6 03/27/2022   PROT 5.6 (L) 03/27/2022   ALBUMIN 3.0 (L)  03/27/2022   BILITOT 1.1 03/27/2022   ALKPHOS 42 03/27/2022   AST 28 03/27/2022   ALT 15 03/27/2022  ANIONGAP 9 03/27/2022    CBG (last 3)  Recent Labs    03/26/22 2144 03/27/22 0753 03/27/22 1151  GLUCAP 103* 115* 137*       Coagulation Profile: No results for input(s): "INR", "PROTIME" in the last 168 hours.   Radiology Studies: CT HIP LEFT WO CONTRAST  Result Date: 03/27/2022 CLINICAL DATA:  Hip replacement, periprosthetic fracture suspected EXAM: CT OF THE LEFT HIP WITHOUT CONTRAST TECHNIQUE: Multidetector CT imaging of the left hip was performed according to the standard protocol. Multiplanar CT image reconstructions were also generated. RADIATION DOSE REDUCTION: This exam was performed according to the departmental dose-optimization program which includes automated exposure control, adjustment of the mA and/or kV according to patient size and/or use of iterative reconstruction technique. COMPARISON:  03/24/2022 x-ray FINDINGS: Bones/Joint/Cartilage Postsurgical changes following recent left hip hemiarthroplasty. Arthroplasty components are in their expected alignment. No periprosthetic lucency or fracture. Visualized portion of the left hemipelvis is intact without fracture or diastasis. The bones are demineralized. No focal lytic or sclerotic bone lesion. A small vascular channel is noted along the posteromedial cortex of the proximal femoral diaphysis (series 9, image 64). Ligaments Suboptimally assessed by CT. Muscles and Tendons A 1.1 x 0.4 x 0.6 cm bony fragment is seen within the proximal left vastus lateralis muscle belly at the anterior aspect of the proximal thigh (series 7, image 45). Soft tissue air within the anterior compartment musculature related to surgical incision. No well-defined intramuscular hematoma. 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 Soft tissues Soft tissue  edema and ill-defined fluid overlying the anterolateral aspect of the proximal hip and thigh compatible with recent postsurgical change. No organized fluid collection or hematoma. No left inguinal lymphadenopathy. Vascular calcifications are present. IMPRESSION: 1. Postsurgical changes following recent left hip hemiarthroplasty. No periprosthetic lucency or fracture. 2. A 1.1 x 0.4 x 0.6 cm bony fragment is seen within the proximal left vastus lateralis muscle belly at the anterior aspect of the proximal thigh. 3. 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 Electronically Signed   By: Davina Poke D.O.   On: 03/27/2022 08:44       Hosie Poisson M.D. [name] 03/27/2022, 12:47 PM  Available via Epic secure chat 7am-7pm After 7 pm, please refer to night coverage provider listed on amion.

## 2022-03-27 NOTE — Evaluation (Signed)
Occupational Therapy Evaluation Patient Details Name: [name] Dodson MRN: [id number] DOB: [birth year] Today's Date: 03/27/2022   History of Present Illness 77 yo female admitted with L hip fx after falling at home. Pt is s/p L hip hemiarthroplasty-anterior approach 03/24/22. Hx of R THA, Afib, chronic pain, sciatica, back pain, dyspnea, lumbar decompression 2018   Clinical Impression   [name] Dodson is a 77 year old woman who presents s/p hip fracture repair with significant pain in both LLE and RLE today, decreased ROM and strength of Les, poor activity tolerance and impaired balance. Patient total assist for transfers and LB ADLs. She barely tolerated moving her legs to the edge of bed and complaining of R sided pain today. Inevitably needing helicopter method to  transfer to edge of bed. Required use of stedy for transfer. Increased amount of time in room due to educating patient on need to move and participate. Patient will benefit from skilled OT services while in hospital to improve deficits and learn compensatory strategies as needed in order to improve patient's functional abilities      Recommendations for follow up therapy are one component of a multi-disciplinary discharge planning process, led by the attending physician.  Recommendations may be updated based on patient status, additional functional criteria and insurance authorization.   Follow Up Recommendations  Skilled nursing-short term rehab (<3 hours/day)     Assistance Recommended at Discharge Frequent or constant Supervision/Assistance  Patient can return home with the following Two people to help with bathing/dressing/bathroom;Assistance with cooking/housework;Two people to help with walking and/or transfers;Assist for transportation;Help with stairs or ramp for entrance    Functional Status Assessment  Patient has had a recent decline in their functional status and demonstrates the ability to make significant improvements in  function in a reasonable and predictable amount of time.  Equipment Recommendations  None recommended by OT    Recommendations for Other Services       Precautions / Restrictions Precautions Precautions: Fall Restrictions Weight Bearing Restrictions: No Other Position/Activity Restrictions: WBAT      Mobility Bed Mobility Overal bed mobility: Needs Assistance Bed Mobility: Supine to Sit     Supine to sit: Total assist, +2 for physical assistance, HOB elevated     General bed mobility comments: Attempted to get patient to transfer legs towards edge of bed but patient resistant and complaining of pain in both RLE and LLE - but with more pain in the right. PT in room to assist with total transfer to edge of bed via helicopter method.    Transfers                          Balance Overall balance assessment: Needs assistance Sitting-balance support: No upper extremity supported, Feet supported Sitting balance-Leahy Scale: Poor Sitting balance - Comments: leaning posteriorly                                   ADL either performed or assessed with clinical judgement   ADL Overall ADL's : Needs assistance/impaired Eating/Feeding: Set up;Sitting   Grooming: Set up;Sitting   Upper Body Bathing: Minimal assistance;Sitting   Lower Body Bathing: Total assistance;Bed level   Upper Body Dressing : Set up;Sitting   Lower Body Dressing: Total assistance;Bed level   Toilet Transfer: Total assistance   Toileting- Clothing Manipulation and Hygiene: Total assistance;Bed level  Vision Patient Visual Report: No change from baseline       Perception     Praxis      Pertinent Vitals/Pain Pain Assessment Pain Assessment: 0-10 Pain Score: 10-Worst pain ever Pain Location: RIGHT HIP, Left hip Pain Descriptors / Indicators: Grimacing, Guarding, Sharp, Aching, Discomfort Pain Intervention(s): Limited activity within patient's  tolerance, Monitored during session, Repositioned, Premedicated before session     Hand Dominance Right   Extremity/Trunk Assessment Upper Extremity Assessment Upper Extremity Assessment: Overall WFL for tasks assessed   Lower Extremity Assessment Lower Extremity Assessment: Defer to PT evaluation   Cervical / Trunk Assessment Cervical / Trunk Assessment: Normal   Communication Communication Communication: No difficulties   Cognition Arousal/Alertness: Suspect due to medications Behavior During Therapy: WFL for tasks assessed/performed Overall Cognitive Status: Impaired/Different from baseline Area of Impairment: Problem solving, Orientation, Attention, Memory, Safety/judgement, Awareness                 Orientation Level: Disoriented to, Time Current Attention Level: Sustained Memory: Decreased short-term memory   Safety/Judgement: Decreased awareness of deficits, Decreased awareness of safety Awareness: Emergent Problem Solving: Requires verbal cues, Requires tactile cues General Comments: Cognitive deficits likely due to pain medication. She is alert to self and place. Unable to answer PLOF questions. Is looking at her daughter to answer them.     General Comments       Exercises     Shoulder Instructions      Home Living Family/patient expects to be discharged to:: Unsure Living Arrangements: Alone Available Help at Discharge: Family Type of Home: House Home Access: Level entry     Home Layout: One level               Home Equipment: Rollator (4 wheels)   Additional Comments: info above is daughter's home setup-pt will d/c to daughter's if she does not go to rehab      Prior Functioning/Environment Prior Level of Function : Independent/Modified Independent             Mobility Comments: uses rollator as needed ADLs Comments: was independent wtih ADLs        OT Problem List: Decreased strength;Decreased range of motion;Decreased  activity tolerance;Impaired balance (sitting and/or standing);Decreased knowledge of use of DME or AE;Decreased knowledge of precautions;Pain;Impaired UE functional use;Obesity;Increased edema;Decreased safety awareness      OT Treatment/Interventions: Self-care/ADL training;Therapeutic exercise;DME and/or AE instruction;Therapeutic activities;Balance training;Patient/family education;Modalities    OT Goals(Current goals can be found in the care plan section) Acute Rehab OT Goals Patient Stated Goal: to stand and walk OT Goal Formulation: With family Time For Goal Achievement: 04/10/22 Potential to Achieve Goals: Good  OT Frequency: Min 2X/week    Co-evaluation PT/OT/SLP Co-Evaluation/Treatment: Yes            AM-PAC OT "6 Clicks" Daily Activity     Outcome Measure Help from another person eating meals?: A Little Help from another person taking care of personal grooming?: A Little Help from another person toileting, which includes using toliet, bedpan, or urinal?: Total Help from another person bathing (including washing, rinsing, drying)?: A Lot Help from another person to put on and taking off regular upper body clothing?: A Little Help from another person to put on and taking off regular lower body clothing?: Total 6 Click Score: 13   End of Session Equipment Utilized During Treatment: Other (comment) (stedy) Nurse Communication: Mobility status  Activity Tolerance: Patient tolerated treatment well Patient left: in chair  OT  Visit Diagnosis: Pain;Other abnormalities of gait and mobility (R26.89) Pain - Right/Left: Right Pain - part of body: Hip                Time: 0930-1010 OT Time Calculation (min): 40 min Charges:  OT General Charges $OT Visit: 1 Visit OT Evaluation $OT Eval Low Complexity: 1 Low OT Treatments $Therapeutic Activity: 8-22 mins  Gustavo Lah, OTR/L [place]  Office [phone]   Lenward Chancellor 03/27/2022, 12:45 PM

## 2022-03-27 NOTE — Care Management Important Message (Signed)
Important Message  Patient Details IM Letter given. Name: THESSALY KARIS MRN: [id number] Date of Birth: 1945-12-20   Medicare Important Message Given:  Yes     Kerin Salen 03/27/2022, 1:07 PM

## 2022-03-27 NOTE — Progress Notes (Signed)
Physical Therapy Treatment Patient Details Name: [name] Dodson MRN: [id number] DOB: 03-26-1945 Today's Date: 03/27/2022   History of Present Illness 77 yo female admitted with L hip fx after falling at home. Pt is s/p L hip hemiarthroplasty-anterior approach 03/24/22. Hx of R THA, Afib, chronic pain, sciatica, back pain, dyspnea, lumbar decompression 2018    PT Comments    POD # 3 pm session Pt sitting in recliner more conversive with family in room.   General Comments: more alert/Sprite this afternnon.  Following all commands.  Exhibits MAX anxiety with every movement.  Mild cognitive memory deficits noted. General transfer comment: used STEDY to transfer pt from recliner back to bed.  Pt was able to static stand in STEDY approx 90 seconds before sifns of fatigue. Pt unable to support self in standing, unable to weightshift or take ant steps to amb.  General bed mobility comments: Total Assist + 2 back to bed then positioned to comfort.  X ray arrived. Family plans to take pt home.  Will need PTAR transport.  VERY difficult to get her in/out car at this point.    Recommendations for follow up therapy are one component of a multi-disciplinary discharge planning process, led by the attending physician.  Recommendations may be updated based on patient status, additional functional criteria and insurance authorization.  Follow Up Recommendations  Skilled nursing-short term rehab (<3 hours/day) (family declines SNF) Can patient physically be transported by private vehicle: No   Assistance Recommended at Discharge Frequent or constant Supervision/Assistance  Patient can return home with the following Assistance with cooking/housework;Assist for transportation;Help with stairs or ramp for entrance;A lot of help with walking and/or transfers;A lot of help with bathing/dressing/bathroom   Equipment Recommendations  Rolling walker (2 wheels);BSC/3in1    Recommendations for Other Services        Precautions / Restrictions Precautions Precautions: Fall Restrictions Weight Bearing Restrictions: No Other Position/Activity Restrictions: WBAT     Mobility  Bed Mobility Overal bed mobility: Needs Assistance Bed Mobility: Sit to Supine     Supine to sit: Total assist, +2 for physical assistance, HOB elevated     General bed mobility comments: Total Assist + 2 back to bed then positioned to comfort.    Transfers Overall transfer level: Needs assistance Equipment used: Rolling walker (2 wheels) Transfers: Sit to/from Stand Sit to Stand: +2 physical assistance, +2 safety/equipment, Max assist, Total assist           General transfer comment: used STEDY to transfer pt from recliner back to bed.  Pt was able to static stand in STEDY approx 90 seconds before sifns of fatigue.    Ambulation/Gait               General Gait Details: pt unable to tolerate standing   Stairs             Wheelchair Mobility    Modified Rankin (Stroke Patients Only)       Balance                                            Cognition Arousal/Alertness: Awake/alert Behavior During Therapy: WFL for tasks assessed/performed Overall Cognitive Status: Within Functional Limits for tasks assessed  General Comments: more alert/Sprite this afternnon.  Following all commands.  Exhibits MAX anxiety with every movement.  Mild cognitive memory deficits noted.        Exercises      General Comments        Pertinent Vitals/Pain Pain Assessment Pain Assessment: Faces Faces Pain Scale: Hurts even more Pain Location: RIGHT HIP, Left hip Pain Descriptors / Indicators: Grimacing, Guarding, Sharp, Aching, Discomfort Pain Intervention(s): Monitored during session, Premedicated before session, Repositioned, Ice applied    Home Living Family/patient expects to be discharged to:: Unsure Living Arrangements:  Alone Available Help at Discharge: Family Type of Home: House Home Access: Level entry       Home Layout: One level Home Equipment: Rollator (4 wheels) Additional Comments: info above is daughter's home setup-pt will d/c to daughter's if she does not go to rehab    Prior Function            PT Goals (current goals can now be found in the care plan section) Progress towards PT goals: Progressing toward goals    Frequency    Min 3X/week      PT Plan Current plan remains appropriate    Co-evaluation              AM-PAC PT "6 Clicks" Mobility   Outcome Measure  Help needed turning from your back to your side while in a flat bed without using bedrails?: A Lot Help needed moving from lying on your back to sitting on the side of a flat bed without using bedrails?: A Lot Help needed moving to and from a bed to a chair (including a wheelchair)?: Total Help needed standing up from a chair using your arms (e.g., wheelchair or bedside chair)?: Total Help needed to walk in hospital room?: Total Help needed climbing 3-5 steps with a railing? : Total 6 Click Score: 8    End of Session Equipment Utilized During Treatment: Gait belt Activity Tolerance: Patient limited by pain Patient left: in bed;with call bell/phone within reach;with family/visitor present Nurse Communication: Mobility status PT Visit Diagnosis: Pain;Muscle weakness (generalized) (M62.81);Difficulty in walking, not elsewhere classified (R26.2);History of falling (Z91.81) Pain - Right/Left: Right Pain - part of body: Hip     Time: 1222-1250 PT Time Calculation (min) (ACUTE ONLY): 28 min  Charges:  $Therapeutic Activity: 23-37 mins                     Rica Koyanagi  PTA Acute  Rehabilitation Services Office M-F          [phone] Weekend pager [phone]

## 2022-03-27 NOTE — Plan of Care (Signed)
  Problem: Health Behavior/Discharge Planning: Goal: Ability to manage health-related needs will improve Outcome: Progressing   Problem: Clinical Measurements: Goal: Ability to maintain clinical measurements within normal limits will improve Outcome: Progressing Goal: Will remain free from infection Outcome: Progressing Goal: Diagnostic test results will improve Outcome: Progressing Goal: Respiratory complications will improve Outcome: Progressing Goal: Cardiovascular complication will be avoided Outcome: Progressing   Problem: Activity: Goal: Risk for activity intolerance will decrease Outcome: Progressing   Problem: Nutrition: Goal: Adequate nutrition will be maintained Outcome: Progressing   Problem: Coping: Goal: Level of anxiety will decrease Outcome: Progressing   Problem: Elimination: Goal: Will not experience complications related to bowel motility Outcome: Progressing   Problem: Pain Managment: Goal: General experience of comfort will improve Outcome: Progressing   Problem: Safety: Goal: Ability to remain free from injury will improve Outcome: Progressing   Problem: Skin Integrity: Goal: Risk for impaired skin integrity will decrease Outcome: Progressing   Problem: Education: Goal: Verbalization of understanding the information provided (i.e., activity precautions, restrictions, etc) will improve Outcome: Progressing   Problem: Activity: Goal: Ability to ambulate and perform ADLs will improve Outcome: Progressing   Problem: Clinical Measurements: Goal: Postoperative complications will be avoided or minimized Outcome: Progressing   Problem: Self-Concept: Goal: Ability to maintain and perform role responsibilities to the fullest extent possible will improve Outcome: Progressing   Problem: Pain Management: Goal: Pain level will decrease Outcome: Progressing   Problem: Education: Goal: Ability to describe self-care measures that may prevent or  decrease complications (Diabetes Survival Skills Education) will improve Outcome: Progressing   Problem: Coping: Goal: Ability to adjust to condition or change in health will improve Outcome: Progressing   Problem: Health Behavior/Discharge Planning: Goal: Ability to identify and utilize available resources and services will improve Outcome: Progressing Goal: Ability to manage health-related needs will improve Outcome: Progressing   Problem: Metabolic: Goal: Ability to maintain appropriate glucose levels will improve Outcome: Progressing   Problem: Nutritional: Goal: Maintenance of adequate nutrition will improve Outcome: Progressing Goal: Progress toward achieving an optimal weight will improve Outcome: Progressing   Problem: Skin Integrity: Goal: Risk for impaired skin integrity will decrease Outcome: Progressing   Problem: Tissue Perfusion: Goal: Adequacy of tissue perfusion will improve Outcome: Progressing

## 2022-03-27 NOTE — TOC Progression Note (Signed)
Transition of Care [facility]) - Progression Note    Patient Details  Name: LEISEL HEYSE MRN: [id number] Date of Birth: 1945/07/29  [facility] CM/SW Contact  Jullie Arps, Juliann Pulse, RN Phone Number: 03/27/2022, 11:15 AM  Clinical Narrative:  Damaris Schooner to Elizabeth(dtr) about d/c plans-still plans to d/c home w/HHC-[facility]/OT;rw,3n1-[facility] to deliver to rm prior d/c.see prior TOC note for d/c address.     Expected Discharge Plan: [place] Barriers to Discharge: Continued Medical Work up  Expected Discharge Plan and Services   Discharge Planning Services: CM Consult Post Acute Care Choice: Home Health, Durable Medical Equipment Living arrangements for the past 2 months: [place]                 DME Arranged: 3-N-1, Walker rolling DME Agency: [facility] Date DME Agency Contacted: 03/27/22 Time DME Agency Contacted: [phone] Representative spoke with at DME Agency: Erasmo Downer HH Arranged: PT, OT HH Agency: [place] Date [place]: 03/27/22 Time [place]: 1114 Representative spoke with at [place]: Elbert Determinants of Health ([place]) Interventions SDOH Screenings   Food Insecurity: No Food Insecurity (03/24/2022)  Housing: Low Risk  (03/24/2022)  Transportation Needs: No Transportation Needs (03/24/2022)  Utilities: Not At Risk (03/24/2022)  Depression (PHQ2-9): Medium Risk (11/06/2021)  Tobacco Use: Medium Risk (03/25/2022)    Readmission Risk Interventions     No data to display

## 2022-03-27 NOTE — Progress Notes (Signed)
Physical Therapy Treatment Patient Details Name: [name] Dodson MRN: [id number] DOB: [birth year] Today's Date: 03/27/2022   History of Present Illness 77 yo female admitted with L hip fx after falling at home. Pt is s/p L hip hemiarthroplasty-anterior approach 03/24/22. Hx of R THA, Afib, chronic pain, sciatica, back pain, dyspnea, lumbar decompression 2018    PT Comments    POD # 3 am session Pt was pre medicated 10 mg Oxy.  Working with occupational Therapy and unable to sit EOB due to pain in her "other" hip.  RIGHT    Attempted ROM RIGHT hip pt c/o MAX pain (screaming). Assisted to recliner was difficult.  Used STEDY and + 3 assist.  General transfer comment: attempted sit to stand + 2 asisst with walker was unsuccessful due to increased pain of her "other" hip (RIGHT). Marland Kitchen  Used STEDY with + 3 asisst to transfer from bed to recliner  Attempted standing in STEDY however pt unable to achieve upright posture and limited time < 30 seconds. Positioned in recliner to comfort and applied ICE.  Pt c/o feeling "hot" then nausea.  Will reach out to Ortho MD/PA in regards to pt's RIGHT HIP PAIN with weightbearing and ROM.   Recommendations for follow up therapy are one component of a multi-disciplinary discharge planning process, led by the attending physician.  Recommendations may be updated based on patient status, additional functional criteria and insurance authorization.  Follow Up Recommendations  Skilled nursing-short term rehab (<3 hours/day) Can patient physically be transported by private vehicle: No   Assistance Recommended at Discharge Frequent or constant Supervision/Assistance  Patient can return home with the following Assistance with cooking/housework;Assist for transportation;Help with stairs or ramp for entrance;A lot of help with walking and/or transfers;A lot of help with bathing/dressing/bathroom   Equipment Recommendations  Rolling walker (2 wheels)    Recommendations for Other  Services       Precautions / Restrictions Restrictions Weight Bearing Restrictions: No Other Position/Activity Restrictions: WBAT     Mobility  Bed Mobility Overal bed mobility: Needs Assistance Bed Mobility: Supine to Sit     Supine to sit: +2 for physical assistance, +2 for safety/equipment, Total assist     General bed mobility comments: pt required Total Assist + 2 to transfer from supine to EOB.  Pt c/o MAX RIGHT hip pain with any activity.    Transfers Overall transfer level: Needs assistance Equipment used: Rolling walker (2 wheels) Transfers: Sit to/from Stand Sit to Stand: +2 physical assistance, +2 safety/equipment, Max assist, Total assist           General transfer comment: attempted sit to stand + 2 asisst with walker was unsuccessful due to increased pain of her "other" hip (RIGHT). Marland Kitchen  Used STEDY with + 3 asisst to transfer from bed to recliner  Attempted standing in STEDY however pt unable to achieve upright posture and limited time < 30 seconds.    Ambulation/Gait               General Gait Details: pt unable to tolerate standing   Stairs             Wheelchair Mobility    Modified Rankin (Stroke Patients Only)       Balance  Cognition Arousal/Alertness: Awake/alert Behavior During Therapy: WFL for tasks assessed/performed Overall Cognitive Status: Within Functional Limits for tasks assessed                                 General Comments: AxO x 3 very pleasant Lady.  Increased groggy (pain meds)Required increased time as she presents with increased pain.        Exercises      General Comments        Pertinent Vitals/Pain Pain Assessment Pain Assessment: Faces Faces Pain Scale: Hurts whole lot Pain Location: RIGHT HIP Pain Descriptors / Indicators: Grimacing, Guarding, Sharp, Aching, Discomfort Pain Intervention(s): Monitored during session,  Premedicated before session, Repositioned, Ice applied    Home Living                          Prior Function            PT Goals (current goals can now be found in the care plan section) Progress towards PT goals: Progressing toward goals    Frequency    Min 3X/week      PT Plan Current plan remains appropriate    Co-evaluation              AM-PAC PT "6 Clicks" Mobility   Outcome Measure  Help needed turning from your back to your side while in a flat bed without using bedrails?: A Lot Help needed moving from lying on your back to sitting on the side of a flat bed without using bedrails?: A Lot Help needed moving to and from a bed to a chair (including a wheelchair)?: Total Help needed standing up from a chair using your arms (e.g., wheelchair or bedside chair)?: Total Help needed to walk in hospital room?: Total Help needed climbing 3-5 steps with a railing? : Total 6 Click Score: 8    End of Session Equipment Utilized During Treatment: Gait belt Activity Tolerance: Patient limited by pain Patient left: in chair;with call bell/phone within reach;with family/visitor present Nurse Communication: Mobility status PT Visit Diagnosis: Pain;Muscle weakness (generalized) (M62.81);Difficulty in walking, not elsewhere classified (R26.2);History of falling (Z91.81) Pain - Right/Left: Right Pain - part of body: Hip     Time: [id number] PT Time Calculation (min) (ACUTE ONLY): 28 min  Charges:  $Therapeutic Activity: 23-37 mins                     Rica Koyanagi  PTA Acute  Rehabilitation Services Office M-F          [phone] Weekend pager [phone]

## 2022-03-27 NOTE — Progress Notes (Signed)
I have reviewed the CT scan on this patient.  There is no Periprostheticfracture or other Reason not to be aggressive with continued mobilization and her rehabilitation.We will continue to follow her in the hospital but based on her progress yesterday it would appear that skilled nursing is the direction we will need to be going with this patient.

## 2022-03-28 DIAGNOSIS — D72829 Elevated white blood cell count, unspecified: Secondary | ICD-10-CM | POA: Diagnosis not present

## 2022-03-28 DIAGNOSIS — K219 Gastro-esophageal reflux disease without esophagitis: Secondary | ICD-10-CM | POA: Diagnosis not present

## 2022-03-28 DIAGNOSIS — I4891 Unspecified atrial fibrillation: Secondary | ICD-10-CM | POA: Diagnosis not present

## 2022-03-28 DIAGNOSIS — Z96649 Presence of unspecified artificial hip joint: Secondary | ICD-10-CM | POA: Diagnosis not present

## 2022-03-28 LAB — COMPREHENSIVE METABOLIC PANEL
ALT: 17 U/L (ref 0–44)
AST: 20 U/L (ref 15–41)
Albumin: 2.6 g/dL — ABNORMAL LOW (ref 3.5–5.0)
Alkaline Phosphatase: 45 U/L (ref 38–126)
Anion gap: 10 (ref 5–15)
BUN: 20 mg/dL (ref 8–23)
CO2: 25 mmol/L (ref 22–32)
Calcium: 8.5 mg/dL — ABNORMAL LOW (ref 8.9–10.3)
Chloride: 99 mmol/L (ref 98–111)
Creatinine, Ser: 0.49 mg/dL (ref 0.44–1.00)
GFR, Estimated: >60 mL/min (ref >60)
Glucose, Bld: 120 mg/dL — ABNORMAL HIGH (ref 70–99)
Potassium: 3.5 mmol/L (ref 3.5–5.1)
Sodium: 134 mmol/L — ABNORMAL LOW (ref 135–145)
Total Bilirubin: 1.1 mg/dL (ref 0.3–1.2)
Total Protein: 5.6 g/dL — ABNORMAL LOW (ref 6.5–8.1)

## 2022-03-28 LAB — CBC WITH DIFFERENTIAL/PLATELET
Abs Immature Granulocytes: 0.12 10*3/uL — ABNORMAL HIGH (ref 0.00–0.07)
Basophils Absolute: 0.1 10*3/uL (ref 0.0–0.1)
Basophils Relative: 1 %
Eosinophils Absolute: 0.5 10*3/uL (ref 0.0–0.5)
Eosinophils Relative: 5 %
HCT: 29.8 % — ABNORMAL LOW (ref 36.0–46.0)
Hemoglobin: 10.3 g/dL — ABNORMAL LOW (ref 12.0–15.0)
Immature Granulocytes: 1 %
Lymphocytes Relative: 21 %
Lymphs Abs: 2.2 10*3/uL (ref 0.7–4.0)
MCH: 35.3 pg — ABNORMAL HIGH (ref 26.0–34.0)
MCHC: 34.6 g/dL (ref 30.0–36.0)
MCV: 102.1 fL — ABNORMAL HIGH (ref 80.0–100.0)
Monocytes Absolute: 0.9 10*3/uL (ref 0.1–1.0)
Monocytes Relative: 9 %
Neutro Abs: 6.9 10*3/uL (ref 1.7–7.7)
Neutrophils Relative %: 63 %
Platelets: 176 10*3/uL (ref 150–400)
RBC: 2.92 MIL/uL — ABNORMAL LOW (ref 3.87–5.11)
RDW: 12.8 % (ref 11.5–15.5)
WBC: 10.7 10*3/uL — ABNORMAL HIGH (ref 4.0–10.5)
nRBC: 0 % (ref 0.0–0.2)

## 2022-03-28 LAB — PHOSPHORUS: Phosphorus: 2.9 mg/dL (ref 2.5–4.6)

## 2022-03-28 LAB — GLUCOSE, CAPILLARY
Glucose-Capillary: 110 mg/dL — ABNORMAL HIGH (ref 70–99)
Glucose-Capillary: 129 mg/dL — ABNORMAL HIGH (ref 70–99)
Glucose-Capillary: 153 mg/dL — ABNORMAL HIGH (ref 70–99)
Glucose-Capillary: 165 mg/dL — ABNORMAL HIGH (ref 70–99)

## 2022-03-28 LAB — MAGNESIUM: Magnesium: 2 mg/dL (ref 1.7–2.4)

## 2022-03-28 NOTE — Progress Notes (Signed)
@   around 2140 this nurse entered th room to find that the patient had removed surgical dressing from L hip , The dressing in place looked to be AG impregnated which is not avaialaible on this floor, new dressing applied, WCTM

## 2022-03-28 NOTE — Progress Notes (Signed)
[name]                                                                               [name] Dodson, is a 77 y.o. female, DOB - 07-Jan-1946, [id number] Admit date - 03/24/2022    Outpatient Primary MD for the patient is [name] Dancer, NP  LOS - 4  days    Brief summary   77 year old WF PMHx A-fib s/p DCCV 9/23 2016, dysarthria, chronic back pain, GERD, presents with left hip pain. X rays showed impacted femoral neck fracture.  Orthopedics consulted and she underwent left hemiarthroplasty.    Assessment & Plan    Assessment and Plan:  Left hip fracture:  S/p left hemiarthroplasty on 2/19.  Pain control and therapy eval recommends SNF. But patient wants to go home.  Patient unable to stand and minimal participation with PT, orthopedics consulted and plan for CT left hip.  CT hip does not show any periprosthetic hip fracture.  Right hip pain with PT. X rays of the right hip ordered. Unremarkable.  She is not participating much with PT due to pain. Currently she is on Oxycodone 10 to 15 mg daily for severe pain and on 5 to 10 mg for moderate pain.  Continue to monitor.    Atrial fib , paroxysmal  Rate controlled.  NSR.  Not on anti coagulation.    GERD;  Stable on PPI.   Leukocytosis:  Unclear etiology, probably reactive. But improving. Marland Kitchen  UA is negative.  CXR does not show any pneumonia.     Anemia of blood loss from the surgery.  Hemoglobin stable at 11.5.  Macrocytic.  Low normal vit [id number] and low folic acid levels. Replacements ordered.    Hypophosphatemia:  Replaced. Repeat levels wnl.    Constipation:  Added senna, colace, miralax and added dulcolax suppository.   RN Pressure Injury Documentation:    Malnutrition Type:  Nutrition Problem: Increased nutrient needs Etiology: post-op healing, hip fracture   Malnutrition Characteristics:  Signs/Symptoms: estimated needs   Nutrition Interventions:  Interventions: Ensure  Enlive (each supplement provides 350kcal and 20 grams of protein)  Estimated body mass index is 28.83 kg/m as calculated from the following:   Height as of this encounter: '5\' 8"'$  (1.727 m).   Weight as of this encounter: 86 kg.  Code Status: full code DVT Prophylaxis:  SCDs Start: 03/24/22 1809 Place TED hose Start: 03/24/22 1809 SCDs Start: 03/24/22 0840   Level of Care: Level of care: Med-Surg Family Communication: family at bedside.   Disposition Plan:     Remains inpatient appropriate:  discharge when able to tolerate pain.   Procedures:  Left hemiarthroplasty on 2/19   Consultants:   Orthopedics.   Antimicrobials:   Anti-infectives (From admission, onward)    Start     Dose/Rate Route Frequency Ordered Stop   03/25/22 0600  ceFAZolin (ANCEF) IVPB 2g/100 mL premix        2 g 200 mL/hr over 30 Minutes Intravenous On call to O.R. 03/24/22 1220 03/25/22 0817   03/24/22 2000  ceFAZolin (ANCEF) IVPB 2g/100 mL premix        2 g 200 mL/hr over  30 Minutes Intravenous Every 6 hours 03/24/22 1808 03/25/22 1144   03/24/22 1224  ceFAZolin (ANCEF) 2-4 GM/100ML-% IVPB       Note to Pharmacy: Lysle Pearl: cabinet override      03/24/22 1224 03/24/22 1335        Medications  Scheduled Meds:  aspirin EC  325 mg Oral BID PC   vitamin B-12  1,000 mcg Oral Daily   docusate sodium  100 mg Oral BID   feeding supplement  237 mL Oral BID BM   ferrous sulfate  325 mg Oral BID WC   flecainide  100 mg Oral BID   folic acid  1 mg Oral Daily   insulin aspart  0-15 Units Subcutaneous TID WC   insulin aspart  0-5 Units Subcutaneous QHS   ketorolac  15 mg Intravenous Once   pantoprazole  40 mg Oral Daily   polyethylene glycol  17 g Oral Daily   senna-docusate  2 tablet Oral BID   Continuous Infusions:  dextrose 5 % and 0.45% NaCl Stopped (03/25/22 1636)   methocarbamol (ROBAXIN) IV     PRN Meds:.acetaminophen, alum & mag hydroxide-simeth, bisacodyl, calcium carbonate,  magnesium citrate, menthol-cetylpyridinium **OR** phenol, methocarbamol **OR** methocarbamol (ROBAXIN) IV, metoCLOPramide **OR** metoCLOPramide (REGLAN) injection, ondansetron **OR** ondansetron (ZOFRAN) IV, oxyCODONE, oxyCODONE, zolpidem    Subjective:   [name] Dodson was seen and examined today. Pain has improved when compared to two day ago,  No BM yet. Will start her on dulcolax prn.   Objective:   Vitals:   03/28/22 0828 03/28/22 0832 03/28/22 1150 03/28/22 1224  BP:    118/60  Pulse:    75  Resp:    17  Temp:    97.7 F (36.5 C)  TempSrc:    Oral  SpO2: (!) 86% 92% 90% 94%  Weight:      Height:        Intake/Output Summary (Last 24 hours) at 03/28/2022 1329 Last data filed at 03/28/2022 0900 Gross per 24 hour  Intake 240 ml  Output 300 ml  Net -60 ml    Filed Weights   03/24/22 0544 03/24/22 1204  Weight: 86.2 kg 86 kg     Exam General exam: Appears calm and comfortable  Respiratory system: Clear to auscultation. Respiratory effort normal. Cardiovascular system: S1 & S2 heard, RRR. No JVD, murmurs Gastrointestinal system: Abdomen is nondistended, soft and nontender.  Central nervous system: Alert and oriented. No focal neurological deficits. Extremities: painful ROM of the left hip.  Skin: No rashes, lesions or ulcers Psychiatry:  Mood & affect appropriate.     Data Reviewed:  I have personally reviewed following labs and imaging studies   CBC Lab Results  Component Value Date   WBC 10.7 (H) 03/28/2022   RBC 2.92 (L) 03/28/2022   HGB 10.3 (L) 03/28/2022   HCT 29.8 (L) 03/28/2022   MCV 102.1 (H) 03/28/2022   MCH 35.3 (H) 03/28/2022   PLT 176 03/28/2022   MCHC 34.6 03/28/2022   RDW 12.8 03/28/2022   LYMPHSABS 2.2 03/28/2022   MONOABS 0.9 03/28/2022   EOSABS 0.5 03/28/2022   BASOSABS 0.1 [id number]     Last metabolic panel Lab Results  Component Value Date   NA 134 (L) 03/28/2022   K 3.5 03/28/2022   CL 99 03/28/2022   CO2 25 03/28/2022    BUN 20 03/28/2022   CREATININE 0.49 03/28/2022   GLUCOSE 120 (H) 03/28/2022   GFRNONAA >60 03/28/2022   GFRAA >  60 06/06/2016   CALCIUM 8.5 (L) 03/28/2022   PHOS 2.9 03/28/2022   PROT 5.6 (L) 03/28/2022   ALBUMIN 2.6 (L) 03/28/2022   BILITOT 1.1 03/28/2022   ALKPHOS 45 03/28/2022   AST 20 03/28/2022   ALT 17 03/28/2022   ANIONGAP 10 03/28/2022    CBG (last 3)  Recent Labs    03/27/22 2032 03/28/22 0738 03/28/22 1227  GLUCAP 102* 110* 129*       Coagulation Profile: No results for input(s): "INR", "PROTIME" in the last 168 hours.   Radiology Studies: DG HIP UNILAT WITH PELVIS 2-3 VIEWS RIGHT  Result Date: 03/27/2022 CLINICAL DATA:  Hip pain. EXAM: DG HIP (WITH OR WITHOUT PELVIS) 2-3V RIGHT COMPARISON:  Bilateral hip radiographs 03/24/2022, CT left hip 03/26/2022 FINDINGS: Status post bilateral total hip arthroplasty. No perihardware lucency is seen to indicate hardware failure or loosening. Recent left hip postoperative lateral subcutaneous air. A tiny cortical fragment overlying the lateral proximal femoral metadiaphysis is seen to be within the vastus lateralis muscle on yesterday's CT. Diffuse decreased bone mineralization.  No acute fracture is seen. L3-4 posterior rod and screw fusion hardware with associated intervertebral disc spacer. IMPRESSION: Status post bilateral total hip arthroplasty without evidence of hardware failure. Electronically Signed   By: Yvonne Kendall M.D.   On: 03/27/2022 13:33   CT HIP LEFT WO CONTRAST  Result Date: 03/27/2022 CLINICAL DATA:  Hip replacement, periprosthetic fracture suspected EXAM: CT OF THE LEFT HIP WITHOUT CONTRAST TECHNIQUE: Multidetector CT imaging of the left hip was performed according to the standard protocol. Multiplanar CT image reconstructions were also generated. RADIATION DOSE REDUCTION: This exam was performed according to the departmental dose-optimization program which includes automated exposure control, adjustment of the  mA and/or kV according to patient size and/or use of iterative reconstruction technique. COMPARISON:  03/24/2022 x-ray FINDINGS: Bones/Joint/Cartilage Postsurgical changes following recent left hip hemiarthroplasty. Arthroplasty components are in their expected alignment. No periprosthetic lucency or fracture. Visualized portion of the left hemipelvis is intact without fracture or diastasis. The bones are demineralized. No focal lytic or sclerotic bone lesion. A small vascular channel is noted along the posteromedial cortex of the proximal femoral diaphysis (series 9, image 64). Ligaments Suboptimally assessed by CT. Muscles and Tendons A 1.1 x 0.4 x 0.6 cm bony fragment is seen within the proximal left vastus lateralis muscle belly at the anterior aspect of the proximal thigh (series 7, image 45). Soft tissue air within the anterior compartment musculature related to surgical incision. No well-defined intramuscular hematoma. 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 Soft tissues Soft tissue edema and ill-defined fluid overlying the anterolateral aspect of the proximal hip and thigh compatible with recent postsurgical change. No organized fluid collection or hematoma. No left inguinal lymphadenopathy. Vascular calcifications are present. IMPRESSION: 1. Postsurgical changes following recent left hip hemiarthroplasty. No periprosthetic lucency or fracture. 2. A 1.1 x 0.4 x 0.6 cm bony fragment is seen within the proximal left vastus lateralis muscle belly at the anterior aspect of the proximal thigh. 3. 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 Electronically Signed   By: Davina Poke D.O.   On: 03/27/2022 08:44       Hosie Poisson M.D. [name] 03/28/2022, 1:29 PM  Available via Epic secure chat 7am-7pm After 7 pm, please  refer to night coverage provider listed on amion.

## 2022-03-28 NOTE — NC FL2 (Signed)
[place] LEVEL OF CARE FORM     IDENTIFICATION  Patient Name: MERIAH BETTINGER Birthdate: Mar 22, 1945 Sex: female Admission Date (Current Location): 03/24/2022  [facility] and [place] Number:  Herbalist and Address:  [facility],  [address] [place], [place]      Provider Number: [id number]  Attending Physician Name and Address:  Hosie Poisson, MD  Relative Name and Phone Number:   [name] [phone]    Current Level of Care: Hospital Recommended Level of Care: [place] Prior Approval Number:    Date Approved/Denied:   PASRR Number:  ([id number])  Discharge Plan: SNF    Current Diagnoses: Patient Active Problem List   Diagnosis Date Noted   Closed left hip fracture, initial encounter ([place]) 03/24/2022   Chronic back pain 03/24/2022   Cerebral ventriculomegaly 03/24/2022   Leukocytosis 03/24/2022   Polycythemia 03/24/2022   Hyperglycemia 03/24/2022   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   Atrial fibrillation, unspecified type ([place]) 11/06/2021   Arthritis 11/06/2021   Gastroesophageal reflux disease 11/06/2021   Osteopenia 11/06/2021   Chronic swimmer's ear of both sides 01/10/2019    Orientation RESPIRATION BLADDER Height & Weight     Self, Time, Situation, Place  Normal Continent Weight: 86 kg Height:  '5\' 8"'$  (172.7 cm)  BEHAVIORAL SYMPTOMS/MOOD NEUROLOGICAL BOWEL NUTRITION STATUS      Continent Diet  AMBULATORY STATUS COMMUNICATION OF NEEDS Skin   Extensive Assist Verbally Bruising, Surgical wounds, Skin abrasions (L hip surgical incision;R leg brusing/skin tear)                       Personal Care Assistance Level of Assistance  Bathing, Feeding, Dressing Bathing Assistance: Limited assistance Feeding assistance: Limited assistance Dressing Assistance: Limited assistance     Functional Limitations Info  Sight, Hearing, Speech Sight Info: Adequate Hearing Info:  Adequate Speech Info: Adequate    SPECIAL CARE FACTORS FREQUENCY  PT (By licensed PT), OT (By licensed OT)     PT Frequency:  (5x week) OT Frequency:  (5x week)            Contractures Contractures Info: Not present    Additional Factors Info  Code Status, Allergies Code Status Info:  (Full) Allergies Info:  (Chlorhexidine) Psychotropic Info:  (See MAR)         Current Medications (03/28/2022):  This is the current hospital active medication list Current Facility-Administered Medications  Medication Dose Route Frequency Provider Last Rate Last Admin   acetaminophen (TYLENOL) tablet 325-650 mg  325-650 mg Oral Q6H PRN Dereck Leep, PA-C   650 mg at 03/26/22 1038   alum & mag hydroxide-simeth (MAALOX/MYLANTA) 200-200-20 MG/5ML suspension 30 mL  30 mL Oral Q4H PRN Dereck Leep, PA-C       aspirin EC tablet 325 mg  325 mg Oral BID PC Dereck Leep, PA-C   325 mg at 03/28/22 0815   bisacodyl (DULCOLAX) suppository 10 mg  10 mg Rectal Daily PRN Dereck Leep, PA-C       calcium carbonate (TUMS - dosed in mg elemental calcium) chewable tablet 200 mg of elemental calcium  1 tablet Oral Daily PRN Reubin Milan, MD   200 mg of elemental calcium at 03/28/22 1433   cyanocobalamin (VITAMIN B12) tablet 1,000 mcg  1,000 mcg Oral Daily Hosie Poisson, MD   1,000 mcg at 03/28/22 1141   dextrose 5 %-0.45 % sodium chloride infusion  Intravenous Continuous Dereck Leep, PA-C   Stopped at 03/25/22 1636   docusate sodium (COLACE) capsule 100 mg  100 mg Oral BID Dereck Leep, PA-C   100 mg at 03/28/22 1141   feeding supplement (ENSURE ENLIVE / ENSURE PLUS) liquid 237 mL  237 mL Oral BID BM Allie Bossier, MD   237 mL at 03/28/22 1142   ferrous sulfate tablet 325 mg  325 mg Oral BID WC Dereck Leep, PA-C   325 mg at 03/28/22 0815   flecainide (TAMBOCOR) tablet 100 mg  100 mg Oral BID Reubin Milan, MD   100 mg at [id number]   folic acid (FOLVITE) tablet 1 mg   1 mg Oral Daily Hosie Poisson, MD   1 mg at 03/28/22 1141   insulin aspart (novoLOG) injection 0-15 Units  0-15 Units Subcutaneous TID WC Dereck Leep, PA-C   2 Units at 03/26/22 1725   insulin aspart (novoLOG) injection 0-5 Units  0-5 Units Subcutaneous QHS Dereck Leep, PA-C       ketorolac (TORADOL) 15 MG/ML injection 15 mg  15 mg Intravenous Once Reubin Milan, MD       magnesium citrate solution 1 Bottle  1 Bottle Oral Once PRN Dereck Leep, PA-C       menthol-cetylpyridinium (CEPACOL) lozenge 3 mg  1 lozenge Oral PRN Dereck Leep, PA-C       Or   phenol (CHLORASEPTIC) mouth spray 1 spray  1 spray Mouth/Throat PRN Dereck Leep, PA-C       methocarbamol (ROBAXIN) tablet 500 mg  500 mg Oral Q6H PRN Dereck Leep, PA-C   500 mg at 03/28/22 1026   Or   methocarbamol (ROBAXIN) 500 mg in dextrose 5 % 50 mL IVPB  500 mg Intravenous Q6H PRN Dereck Leep, PA-C       metoCLOPramide (REGLAN) tablet 5-10 mg  5-10 mg Oral Q8H PRN Dereck Leep, PA-C       Or   metoCLOPramide (REGLAN) injection 5-10 mg  5-10 mg Intravenous Q8H PRN Dereck Leep, PA-C       ondansetron [facility]) tablet 4 mg  4 mg Oral Q6H PRN Reubin Milan, MD       Or   ondansetron [facility]) injection 4 mg  4 mg Intravenous Q6H PRN Reubin Milan, MD   4 mg at 03/24/22 1348   oxyCODONE (Oxy IR/ROXICODONE) immediate release tablet 10-15 mg  10-15 mg Oral Q4H PRN Dereck Leep, PA-C       oxyCODONE (Oxy IR/ROXICODONE) immediate release tablet 5-10 mg  5-10 mg Oral Q4H PRN Dereck Leep, PA-C   5 mg at 03/28/22 1233   pantoprazole (PROTONIX) EC tablet 40 mg  40 mg Oral Daily Reubin Milan, MD   40 mg at 03/28/22 1142   polyethylene glycol (MIRALAX / GLYCOLAX) packet 17 g  17 g Oral Daily Hosie Poisson, MD   17 g at 03/28/22 1142   senna-docusate (Senokot-S) tablet 2 tablet  2 tablet Oral BID Hosie Poisson, MD   2 tablet at 03/28/22 1141   zolpidem (AMBIEN) tablet 5 mg  5 mg Oral  QHS PRN Dereck Leep, PA-C         Discharge Medications: Please see discharge summary for a list of discharge medications.  Relevant Imaging Results:  Relevant Lab Results:   Additional Information SSN: [id number]  Marytza Grandpre, Juliann Pulse, RN

## 2022-03-28 NOTE — TOC Progression Note (Signed)
Transition of Care [facility]) - Progression Note    Patient Details  Name: [name] Dodson MRN: [id number] Date of Birth: 14-Feb-1945  Transition of Care [facility]) CM/SW Contact  Holten Spano, Juliann Pulse, RN Phone Number: 03/28/2022, 3:56 PM  Clinical Narrative:Faxed out await bed offers-dtr [name] prefers [facility], & [facility].       Expected Discharge Plan: Skilled Nursing Facility Barriers to Discharge: Continued Medical Work up  Expected Discharge Plan and Services   Discharge Planning Services: CM Consult Post Acute Care Choice: Home Health, Durable Medical Equipment Living arrangements for the past 2 months: Single Family Home                 DME Arranged: 3-N-1, [place] rolling DME Agency: [employer] Date DME Agency Contacted: 03/27/22 Time DME Agency Contacted: [phone] Representative spoke with at DME Agency: Erasmo Downer HH Arranged: PT, OT HH Agency: [place] Date [place]: 03/27/22 Time [place]: 1114 Representative spoke with at [place]: [place] Determinants of Health ([place]) Interventions SDOH Screenings   Food Insecurity: No Food Insecurity (03/24/2022)  Housing: Low Risk  (03/24/2022)  Transportation Needs: No Transportation Needs (03/24/2022)  Utilities: Not At Risk (03/24/2022)  Depression (PHQ2-9): Medium Risk (11/06/2021)  Tobacco Use: Medium Risk (03/25/2022)    Readmission Risk Interventions     No data to display

## 2022-03-28 NOTE — Progress Notes (Signed)
Physical Therapy Treatment Patient Details Name: [name] Dodson MRN: [id number] DOB: 1946/01/25 Today's Date: 03/28/2022   History of Present Illness 77 yo female admitted with L hip fx after falling at home. Pt is s/p L hip hemiarthroplasty-anterior approach 03/24/22. Hx of R THA, Afib, chronic pain, sciatica, back pain, dyspnea, lumbar decompression 2018    PT Comments    POD # 4 pm session Pt tolerated OOB in recliner from 10:40am to 1:30pm ish. Assisted out of recliner was very difficult.  General transfer comment: from recliner pt required + 2 side by side assist and 3rd assist needed in front to pull pt upward using a rolled long bed sheet positioned around pt waist, to asisst with standing.  Pt offers <10%.  Placed B UE's on EVA walker platforms pt only able to achieve a partial upright posture.  Upper body lean forward on platforms and buttocks pushing outward.  Max Assist to achieve a more upright posture.  Assisted to [facility].  Total Assist + 2 to control stand to sit.  Pt sat on BSC x 10 min (void).  Same fashion, + 2 side by side assist with GrandSon pulling on rolled long bed sheet placed around pt's waist to rise.  VERY difficult.  Pt was UNABLE to safely stand with EVA so second attempt the STEDY was used.  Rolled to bed and assisted with partial stand to sit EOB. General Gait Details: transfers only this session.  Practiced using BSC. General bed mobility comments: Pt required Total Assist + 2 to position back to bed.  Pt unable to offer no more that 5%. Noted a small peanut sized bright red blood stain on pillow that was placed between B LE Tib/Fib and STEDY  block. Appears to be from R LE mid tib/fib at site of already bruised purple area.  NO break in skin. Reported to RN.  Deep discussion with family and Pt regarding her slow progress and "hopes" to go home.  Pt is requiring + 3 assist to transfer and is not able to functionally amb "yet".  Strongly discussed short term Rehab.  Pt just needs  "more time" and "more aggressive Rehab".  Daughter and Yolanda Bonine have witnessed pt's Therapy sessions here.  Pt is medically stable to for Discharge.  Family now more agreeable to pursue SNF.   Recommendations for follow up therapy are one component of a multi-disciplinary discharge planning process, led by the attending physician.  Recommendations may be updated based on patient status, additional functional criteria and insurance authorization.  Follow Up Recommendations  Skilled nursing-short term rehab (<3 hours/day) Can patient physically be transported by private vehicle: No   Assistance Recommended at Discharge Frequent or constant Supervision/Assistance  Patient can return home with the following Assistance with cooking/housework;Assist for transportation;Help with stairs or ramp for entrance;A lot of help with walking and/or transfers;A lot of help with bathing/dressing/bathroom   Equipment Recommendations  Rolling walker (2 wheels);BSC/3in1    Recommendations for Other Services       Precautions / Restrictions Precautions Precautions: Fall Restrictions Weight Bearing Restrictions: No Other Position/Activity Restrictions: WBAT     Mobility  Bed Mobility   General bed mobility comments: Pt required Total Assist + 2 to position back to bed.  Pt unable to offer no more that 5%.    Transfers Overall transfer level: Needs assistance Equipment used: Bilateral platform walker Transfers: Sit to/from Stand Sit to Stand: +2 physical assistance, +2 safety/equipment, Max assist, Total assist  General transfer comment: from recliner pt required + 2 side by side assist and 3rd assist needed in front to pull pt upward using a rolled long bed sheet positioned around pt waist, to asisst with standing.  Pt offers <10%.  Placed B UE's on EVA walker platforms pt only able to achieve a partial upright posture.  Upper body lean forward on platforms and buttocks pushing outward.   Max Assist to achieve a more upright posture.  Assisted to [facility].  Total Assist + 2 to control stand to sit.  Pt sat on BSC x 10 min (void).  Same fashion, + 2 side by side assist with GrandSon pulling on rolled long bed sheet placed around pt's waist to rise.  VERY difficult.  Pt was UNABLE to safely stand with EVA so second attempt the STEDY was used.  Rolled to bed and assisted with partial stand to sit EOB.    Ambulation/Gait Ambulation/Gait assistance: Total assist, +2 physical assistance, +2 safety/equipment Gait Distance (Feet): 1 Feet Assistive device: Bilateral platform walker (EVA walker) Gait Pattern/deviations: Step-to pattern Gait velocity: decreased     General Gait Details: transfers only this session.  Practiced using BSC.   Stairs             Wheelchair Mobility    Modified Rankin (Stroke Patients Only)       Balance                                            Cognition Arousal/Alertness: Awake/alert Behavior During Therapy: WFL for tasks assessed/performed Overall Cognitive Status: Within Functional Limits for tasks assessed                                 General Comments: more alert/Sprite this afternnon.  Following all commands.  Exhibits MAX anxiety with every movement.  Mild cognitive memory deficits noted.        Exercises      General Comments        Pertinent Vitals/Pain Pain Assessment Pain Assessment: Faces Pain Location: RIGHT HIP, Left hip Pain Descriptors / Indicators: Grimacing, Guarding, Sharp, Aching, Discomfort Pain Intervention(s): Monitored during session, Premedicated before session, Repositioned, Ice applied    Home Living                          Prior Function            PT Goals (current goals can now be found in the care plan section) Progress towards PT goals: Progressing toward goals    Frequency    Min 3X/week      PT Plan Current plan remains appropriate     Co-evaluation              AM-PAC PT "6 Clicks" Mobility   Outcome Measure  Help needed turning from your back to your side while in a flat bed without using bedrails?: A Lot Help needed moving from lying on your back to sitting on the side of a flat bed without using bedrails?: A Lot Help needed moving to and from a bed to a chair (including a wheelchair)?: Total Help needed standing up from a chair using your arms (e.g., wheelchair or bedside chair)?: Total Help needed to walk in hospital room?: Total Help needed  climbing 3-5 steps with a railing? : Total 6 Click Score: 8    End of Session Equipment Utilized During Treatment: Gait belt Activity Tolerance: Patient limited by pain;Patient limited by fatigue Patient left: in bed;with bed alarm set;with call bell/phone within reach;with family/visitor present Nurse Communication: Mobility status PT Visit Diagnosis: Pain;Muscle weakness (generalized) (M62.81);Difficulty in walking, not elsewhere classified (R26.2);History of falling (Z91.81) Pain - Right/Left: Right Pain - part of body: Hip     Time: 1330-1415 PT Time Calculation (min) (ACUTE ONLY): 45 min  Charges:  $Gait Training: 8-22 mins $Therapeutic Activity: 23-37 mins                     Rica Koyanagi  PTA Acute  Rehabilitation Services Office M-F          [phone] Weekend pager [phone]

## 2022-03-28 NOTE — Progress Notes (Signed)
Physical Therapy Treatment Patient Details Name: [name] Dodson MRN: [id number] DOB: [birth year] Today's Date: 03/28/2022   History of Present Illness 77 yo female admitted with L hip fx after falling at home. Pt is s/p L hip hemiarthroplasty-anterior approach 03/24/22. Hx of R THA, Afib, chronic pain, sciatica, back pain, dyspnea, lumbar decompression 2018    PT Comments    POD # 4 am session (premedicated with '5mg'$  OXY) AxO x 2  Following all commands.  Exhibits MAX anxiety with every movement.  Mild cognitive memory deficits noted. Assisted OOB was difficult.  General bed mobility comments: Attempted to use a belt hooked around pt's LE get pt tp move her own legs off side of bed.  Unsuccessful despite repeat VC's and visual instructions.  "I can't move" stated pt.  Pt c/o BOTH hip pain.  Used Total Asisst + 2 swival pivot using bed pad Pt <5% self assist.  Sitting EOB with severe posterior lean.  Unable to self achieve upright posture until physical assist was given.  Pt sat EOB x 5 min with Min Assist. General transfer comment: + 2 side by side assist and 3rd assist needed in front to pull pt upward using a rolled long bed sheet positioned around pt waist, to asisst with standing.  Pt offers <10%.  Placed B UE's on EVA walker platforms pt only able to achieve a partial upright posture.  Upper body lean forward on platforms and buttocks pushing outward.  Max Assist to achieve a more upright posture.  Total Assist + 2 to control stand to sit. General Gait Details: Attempted to take a few steps forward with B platform EVA walker + 2 asisst however pt was unable to weightshift functionally and only took a few small short shuffled steps forward.  Pt unable to achieve a safe upright posture.  Max c/o pain B hips and Max c/o weakness.  Pt became incont urine upond standing. Assisted to recliner.  Assisted with hygiene and TEDS/socks change.  ICE applied L hip. Daughter and Yolanda Bonine present during session and  VERY helpful.  Family declining SNF rec and plan to take her home.  At this point, pt will need ambulance transport , a wheelchair and an in home lift Will see pt again later today to assist back to bed.   Recommendations for follow up therapy are one component of a multi-disciplinary discharge planning process, led by the attending physician.  Recommendations may be updated based on patient status, additional functional criteria and insurance authorization.  Follow Up Recommendations  Skilled nursing-short term rehab (<3 hours/day) Can patient physically be transported by private vehicle: No   Assistance Recommended at Discharge Frequent or constant Supervision/Assistance  Patient can return home with the following Assistance with cooking/housework;Assist for transportation;Help with stairs or ramp for entrance;A lot of help with walking and/or transfers;A lot of help with bathing/dressing/bathroom   Equipment Recommendations  Rolling walker (2 wheels);BSC/3in1    Recommendations for Other Services       Precautions / Restrictions Precautions Precautions: Fall Restrictions Weight Bearing Restrictions: No Other Position/Activity Restrictions: WBAT     Mobility  Bed Mobility Overal bed mobility: Needs Assistance Bed Mobility: Supine to Sit     Supine to sit: Total assist, +2 for physical assistance, HOB elevated     General bed mobility comments: Attempted to use a belt hooked around pt's LE get pt tp move her own legs off side of bed.  Unsuccessful despite repeat VC's and visual instructions.  "I  can't move" stated pt.  Pt c/o BOTH hip pain.  Used Total Asisst + 2 swival pivot using bed pad Pt <5% self assist.  Sitting EOB with severe posterior lean.  Unable to self achieve upright posture until physical assist was given.  Pt sat EOB x 5 min with Min Assist.    Transfers Overall transfer level: Needs assistance Equipment used: Bilateral platform walker (EVA walker) Transfers:  Sit to/from Stand Sit to Stand: +2 physical assistance, +2 safety/equipment, Max assist, Total assist           General transfer comment: + 2 side by side assist and 3rd assist needed in front to pull pt upward using a rolled long bed sheet positioned around pt waist, to asisst with standing.  Pt offers <10%.  Placed B UE's on EVA walker platforms pt only able to achieve a partial upright posture.  Upper body lean forward on platforms and buttocks pushing outward.  Max Assist to achieve a more upright posture.  Total Assist + 2 to control stand to sit.    Ambulation/Gait Ambulation/Gait assistance: Total assist, +2 physical assistance, +2 safety/equipment Gait Distance (Feet): 1 Feet Assistive device: Bilateral platform walker (EVA walker) Gait Pattern/deviations: Step-to pattern Gait velocity: decreased     General Gait Details: Attempted to take a few steps forward with B platform EVA walker + 2 asisst however pt was unable to weightshift functionally and only took a few small short shuffled steps forward.  Pt unable to achieve a safe upright posture.  Max c/o pain B hips and Max c/o weakness.  Pt became incont urine upond standing.   Stairs             Wheelchair Mobility    Modified Rankin (Stroke Patients Only)       Balance                                            Cognition Arousal/Alertness: Awake/alert Behavior During Therapy: WFL for tasks assessed/performed Overall Cognitive Status: Within Functional Limits for tasks assessed                                 General Comments: more alert/Sprite this afternnon.  Following all commands.  Exhibits MAX anxiety with every movement.  Mild cognitive memory deficits noted.        Exercises      General Comments        Pertinent Vitals/Pain Pain Assessment Pain Assessment: Faces Pain Location: RIGHT HIP, Left hip Pain Descriptors / Indicators: Grimacing, Guarding, Sharp,  Aching, Discomfort Pain Intervention(s): Monitored during session, Premedicated before session, Repositioned, Ice applied    Home Living                          Prior Function            PT Goals (current goals can now be found in the care plan section) Progress towards PT goals: Progressing toward goals    Frequency    Min 3X/week      PT Plan Current plan remains appropriate    Co-evaluation              AM-PAC PT "6 Clicks" Mobility   Outcome Measure  Help needed turning from your back to  your side while in a flat bed without using bedrails?: A Lot Help needed moving from lying on your back to sitting on the side of a flat bed without using bedrails?: A Lot Help needed moving to and from a bed to a chair (including a wheelchair)?: Total Help needed standing up from a chair using your arms (e.g., wheelchair or bedside chair)?: Total Help needed to walk in hospital room?: Total Help needed climbing 3-5 steps with a railing? : Total 6 Click Score: 8    End of Session Equipment Utilized During Treatment: Gait belt Activity Tolerance: Patient limited by pain;Patient limited by fatigue Patient left: in chair;with call bell/phone within reach;with family/visitor present Nurse Communication: Mobility status PT Visit Diagnosis: Pain;Muscle weakness (generalized) (M62.81);Difficulty in walking, not elsewhere classified (R26.2);History of falling (Z91.81) Pain - Right/Left: Right Pain - part of body: Hip     Time: [id number] PT Time Calculation (min) (ACUTE ONLY): 40 min  Charges:  $Gait Training: 8-22 mins $Therapeutic Activity: 23-37 mins                     Rica Koyanagi  PTA [place] Office M-F          [phone] Weekend pager [phone]

## 2022-03-29 DIAGNOSIS — K219 Gastro-esophageal reflux disease without esophagitis: Secondary | ICD-10-CM | POA: Diagnosis not present

## 2022-03-29 DIAGNOSIS — I4891 Unspecified atrial fibrillation: Secondary | ICD-10-CM | POA: Diagnosis not present

## 2022-03-29 DIAGNOSIS — D72829 Elevated white blood cell count, unspecified: Secondary | ICD-10-CM | POA: Diagnosis not present

## 2022-03-29 DIAGNOSIS — Z96649 Presence of unspecified artificial hip joint: Secondary | ICD-10-CM | POA: Diagnosis not present

## 2022-03-29 LAB — CBC WITH DIFFERENTIAL/PLATELET
Abs Immature Granulocytes: 0.15 10*3/uL — ABNORMAL HIGH (ref 0.00–0.07)
Basophils Absolute: 0 10*3/uL (ref 0.0–0.1)
Basophils Relative: 0 %
Eosinophils Absolute: 0.4 10*3/uL (ref 0.0–0.5)
Eosinophils Relative: 4 %
HCT: 28.5 % — ABNORMAL LOW (ref 36.0–46.0)
Hemoglobin: 10.2 g/dL — ABNORMAL LOW (ref 12.0–15.0)
Immature Granulocytes: 2 %
Lymphocytes Relative: 15 %
Lymphs Abs: 1.5 10*3/uL (ref 0.7–4.0)
MCH: 35.5 pg — ABNORMAL HIGH (ref 26.0–34.0)
MCHC: 35.8 g/dL (ref 30.0–36.0)
MCV: 99.3 fL (ref 80.0–100.0)
Monocytes Absolute: 0.9 10*3/uL (ref 0.1–1.0)
Monocytes Relative: 10 %
Neutro Abs: 6.8 10*3/uL (ref 1.7–7.7)
Neutrophils Relative %: 69 %
Platelets: 211 10*3/uL (ref 150–400)
RBC: 2.87 MIL/uL — ABNORMAL LOW (ref 3.87–5.11)
RDW: 12.7 % (ref 11.5–15.5)
WBC: 9.9 10*3/uL (ref 4.0–10.5)
nRBC: 0 % (ref 0.0–0.2)

## 2022-03-29 LAB — COMPREHENSIVE METABOLIC PANEL
ALT: 16 U/L (ref 0–44)
AST: 20 U/L (ref 15–41)
Albumin: 2.3 g/dL — ABNORMAL LOW (ref 3.5–5.0)
Alkaline Phosphatase: 45 U/L (ref 38–126)
Anion gap: 8 (ref 5–15)
BUN: 16 mg/dL (ref 8–23)
CO2: 26 mmol/L (ref 22–32)
Calcium: 8.5 mg/dL — ABNORMAL LOW (ref 8.9–10.3)
Chloride: 101 mmol/L (ref 98–111)
Creatinine, Ser: 0.62 mg/dL (ref 0.44–1.00)
GFR, Estimated: >60 mL/min (ref >60)
Glucose, Bld: 130 mg/dL — ABNORMAL HIGH (ref 70–99)
Potassium: 3.2 mmol/L — ABNORMAL LOW (ref 3.5–5.1)
Sodium: 135 mmol/L (ref 135–145)
Total Bilirubin: 1.2 mg/dL (ref 0.3–1.2)
Total Protein: 5.3 g/dL — ABNORMAL LOW (ref 6.5–8.1)

## 2022-03-29 LAB — PHOSPHORUS: Phosphorus: 3.1 mg/dL (ref 2.5–4.6)

## 2022-03-29 LAB — MAGNESIUM: Magnesium: 2.1 mg/dL (ref 1.7–2.4)

## 2022-03-29 LAB — GLUCOSE, CAPILLARY
Glucose-Capillary: 132 mg/dL — ABNORMAL HIGH (ref 70–99)
Glucose-Capillary: 148 mg/dL — ABNORMAL HIGH (ref 70–99)
Glucose-Capillary: 119 mg/dL — ABNORMAL HIGH (ref 70–99)

## 2022-03-29 MED ORDER — POTASSIUM CHLORIDE CRYS ER 20 MEQ PO TBCR
40.0000 meq | EXTENDED_RELEASE_TABLET | Freq: Once | ORAL | Status: AC
  Administered 2022-03-29: 40 meq via ORAL
  Filled 2022-03-29: qty 2

## 2022-03-29 NOTE — Progress Notes (Signed)
[name]                                                                               [name] Dodson, is a 77 y.o. female, DOB - 1945/08/17, [id number] Admit date - 03/24/2022    Outpatient Primary MD for the patient is [name], NP  LOS - 5  days    Brief summary   77 year old WF PMHx A-fib s/p DCCV 9/23 2016, dysarthria, chronic back pain, GERD, presents with left hip pain. X rays showed impacted femoral neck fracture.  Orthopedics consulted and she underwent left hemiarthroplasty.  Therapy eval recommending SNF.     Assessment & Plan    Assessment and Plan:  Left hip fracture:  S/p left hemiarthroplasty on 2/19.  Pain control and therapy eval recommends SNF. But patient wants to go home.  Patient unable to stand and minimal participation with PT, orthopedics consulted and plan for CT left hip.  CT hip does not show any periprosthetic hip fracture.  Right hip pain with PT. X rays of the right hip ordered. Unremarkable.  She is not participating much with PT due to pain. Currently she is on Oxycodone 10 to 15 mg daily for severe pain and on 5 to 10 mg for moderate pain.  Continue to monitor. Repeat therapy eval recommending SNF. Family agreeable.    Atrial fib , paroxysmal  Rate controlled.  NSR.  Not on anti coagulation.    GERD;  Stable on PPI.   Leukocytosis:  Unclear etiology, probably reactive. But improving. Marland Kitchen  UA is negative.  CXR does not show any pneumonia.     Anemia of blood loss from the surgery.  Hemoglobin  stable 10.  Macrocytic.  Low normal vit [id number] and low folic acid levels. Replacements ordered.    Hypophosphatemia:  Replaced. Repeat levels wnl.    Constipation:  Added senna, colace, miralax and added dulcolax suppository.   RN Pressure Injury Documentation:    Malnutrition Type:  Nutrition Problem: Increased nutrient needs Etiology: post-op healing, hip fracture   Malnutrition  Characteristics:  Signs/Symptoms: estimated needs   Nutrition Interventions:  Interventions: Ensure Enlive (each supplement provides 350kcal and 20 grams of protein)  Estimated body mass index is 28.83 kg/m as calculated from the following:   Height as of this encounter: '5\' 8"'$  (1.727 m).   Weight as of this encounter: 86 kg.  Code Status: full code DVT Prophylaxis:  SCDs Start: 03/24/22 1809 Place TED hose Start: 03/24/22 1809 SCDs Start: 03/24/22 0840   Level of Care: Level of care: Med-Surg Family Communication: family at bedside.   Disposition Plan:     Remains inpatient appropriate:  discharge when able to tolerate pain and when bed available .   Procedures:  Left hemiarthroplasty on 2/19   Consultants:   Orthopedics.   Antimicrobials:   Anti-infectives (From admission, onward)    Start     Dose/Rate Route Frequency Ordered Stop   03/25/22 0600  ceFAZolin (ANCEF) IVPB 2g/100 mL premix        2 g 200 mL/hr over 30 Minutes Intravenous On call to O.R. 03/24/22 1220 03/25/22 0817   03/24/22 2000  ceFAZolin (ANCEF) IVPB 2g/100 mL premix        2 g 200 mL/hr over 30 Minutes Intravenous Every 6 hours 03/24/22 1808 03/25/22 1144   03/24/22 1224  ceFAZolin (ANCEF) 2-4 GM/100ML-% IVPB       Note to Pharmacy: Lysle Pearl: cabinet override      03/24/22 1224 03/24/22 1335        Medications  Scheduled Meds:  aspirin EC  325 mg Oral BID PC   vitamin B-12  1,000 mcg Oral Daily   docusate sodium  100 mg Oral BID   feeding supplement  237 mL Oral BID BM   ferrous sulfate  325 mg Oral BID WC   flecainide  100 mg Oral BID   folic acid  1 mg Oral Daily   insulin aspart  0-15 Units Subcutaneous TID WC   insulin aspart  0-5 Units Subcutaneous QHS   pantoprazole  40 mg Oral Daily   polyethylene glycol  17 g Oral Daily   senna-docusate  2 tablet Oral BID   Continuous Infusions:  dextrose 5 % and 0.45% NaCl Stopped (03/25/22 1636)   methocarbamol (ROBAXIN) IV      PRN Meds:.acetaminophen, alum & mag hydroxide-simeth, bisacodyl, calcium carbonate, magnesium citrate, menthol-cetylpyridinium **OR** phenol, methocarbamol **OR** methocarbamol (ROBAXIN) IV, metoCLOPramide **OR** metoCLOPramide (REGLAN) injection, ondansetron **OR** ondansetron (ZOFRAN) IV, oxyCODONE, oxyCODONE, zolpidem    Subjective:   [name] Dodson was seen and examined today. RN reported that she removed the bandage over the incision.  Objective:   Vitals:   03/28/22 1150 03/28/22 1224 03/28/22 2146 03/29/22 0558  BP:  118/60 (!) 151/69 138/61  Pulse:  75 80 73  Resp:  '17 19 18  '$ Temp:  97.7 F (36.5 C) 99.7 F (37.6 C) 98.2 F (36.8 C)  TempSrc:  Oral Oral Oral  SpO2: 90% 94% 92% 91%  Weight:      Height:        Intake/Output Summary (Last 24 hours) at 03/29/2022 1353 Last data filed at 03/29/2022 0858 Gross per 24 hour  Intake 120 ml  Output 1350 ml  Net -1230 ml    Filed Weights   03/24/22 0544 03/24/22 1204  Weight: 86.2 kg 86 kg     Exam General exam: Appears calm and comfortable  Respiratory system: Clear to auscultation. Respiratory effort normal. Cardiovascular system: S1 & S2 heard, RRR. No JVD, murmurs,  Gastrointestinal system: Abdomen is nondistended, soft and nontender.  Central nervous system: Alert and oriented. No focal neurological deficits. Extremities: painful ROM of the left hip.  Skin: No rashes,  Psychiatry:  Mood & affect appropriate.      Data Reviewed:  I have personally reviewed following labs and imaging studies   CBC Lab Results  Component Value Date   WBC 9.9 03/29/2022   RBC 2.87 (L) 03/29/2022   HGB 10.2 (L) 03/29/2022   HCT 28.5 (L) 03/29/2022   MCV 99.3 03/29/2022   MCH 35.5 (H) 03/29/2022   PLT 211 03/29/2022   MCHC 35.8 03/29/2022   RDW 12.7 03/29/2022   LYMPHSABS 1.5 03/29/2022   MONOABS 0.9 03/29/2022   EOSABS 0.4 03/29/2022   BASOSABS 0.0 [id number]     Last metabolic panel Lab Results  Component Value  Date   NA 135 03/29/2022   K 3.2 (L) 03/29/2022   CL 101 03/29/2022   CO2 26 03/29/2022   BUN 16 03/29/2022   CREATININE 0.62 03/29/2022   GLUCOSE 130 (H) 03/29/2022   GFRNONAA >60  03/29/2022   GFRAA >60 06/06/2016   CALCIUM 8.5 (L) 03/29/2022   PHOS 3.1 03/29/2022   PROT 5.3 (L) 03/29/2022   ALBUMIN 2.3 (L) 03/29/2022   BILITOT 1.2 03/29/2022   ALKPHOS 45 03/29/2022   AST 20 03/29/2022   ALT 16 03/29/2022   ANIONGAP 8 03/29/2022    CBG (last 3)  Recent Labs    03/28/22 1716 03/28/22 2154 03/29/22 0826  GLUCAP 153* 165* 132*       Coagulation Profile: No results for input(s): "INR", "PROTIME" in the last 168 hours.   Radiology Studies: No results found.     Hosie Poisson M.D. [name] 03/29/2022, 1:53 PM  Available via Epic secure chat 7am-7pm After 7 pm, please refer to night coverage provider listed on amion.

## 2022-03-29 NOTE — Progress Notes (Addendum)
Physical Therapy Treatment Patient Details Name: [name] Dodson MRN: [id number] DOB: [birth year] Today's Date: 03/29/2022   History of Present Illness 77 yo female admitted with L hip fx after falling at home. Pt is s/p L hip hemiarthroplasty-anterior approach 03/24/22. Hx of R THA, Afib, chronic pain, sciatica, back pain, dyspnea, lumbar decompression 2018    PT Comments    Pt continues to require significant +2 assist for bed mobility, standing/transferring with STEDY lift equipment. She continues to c/o bil LE pain (R anterior thigh/groin and L hip). She participated fairly well with encouragement for participation and progression of activity. Continue to recommend SNF.     Recommendations for follow up therapy are one component of a multi-disciplinary discharge planning process, led by the attending physician.  Recommendations may be updated based on patient status, additional functional criteria and insurance authorization.  Follow Up Recommendations  Skilled nursing-short term rehab (<3 hours/day) Can patient physically be transported by private vehicle: No   Assistance Recommended at Discharge Frequent or constant Supervision/Assistance  Patient can return home with the following Assistance with cooking/housework;Assist for transportation;Help with stairs or ramp for entrance;Two people to help with walking and/or transfers;Two people to help with bathing/dressing/bathroom   Equipment Recommendations  Rolling walker (2 wheels);BSC/3in1; If home [facility] bed,ambulance transport, possibly hoyer lift)    Recommendations for Other Services       Precautions / Restrictions Precautions Precautions: Fall Restrictions Weight Bearing Restrictions: No Other Position/Activity Restrictions: WBAT     Mobility  Bed Mobility Overal bed mobility: Needs Assistance Bed Mobility: Supine to Sit     Supine to sit: Max assist, +2 for physical assistance, +2 for safety/equipment, HOB elevated      General bed mobility comments: Multimodal cueing required. Assist for trunk and bil LEs. Utilized bepad to assist with scooting, positioning at EOB. Increased time.    Transfers Overall transfer level: Needs assistance Equipment used: Rolling walker (2 wheels) Transfers: Sit to/from Stand Sit to Stand: Mod assist, Max assist, +2 physical assistance, +2 safety/equipment, From elevated surface           General transfer comment: Highly elevated bed surface (slightly lower than STEDY seat height). Varying level of A required for sit to stand on today (Mod +2 x 2, Max +2 x 1)-sit to stand x 3-pt stood for ~10 seconds each time. WBing is still quite painful bilaterally. Multimodal cueing and encouragment required. Able to stand and transfer to recliner using STEDY with +2 assist on today.    Ambulation/Gait               General Gait Details: not yet ready   Stairs             Wheelchair Mobility    Modified Rankin (Stroke Patients Only)       Balance Overall balance assessment: Needs assistance Sitting-balance support: Feet supported, Bilateral upper extremity supported Sitting balance-Leahy Scale: Fair Sitting balance - Comments: leans to R side to unweight L hip. also posterior lean   Standing balance support: Bilateral upper extremity supported, Reliant on assistive device for balance Standing balance-Leahy Scale: Poor                              Cognition Arousal/Alertness: Awake/alert Behavior During Therapy: WFL for tasks assessed/performed   Area of Impairment: Problem solving                 Orientation Level: Disoriented  to, Time   Memory: Decreased short-term memory       Problem Solving: Requires verbal cues, Requires tactile cues General Comments: Participated well. Multimodal cues. Anxious. Mild cognitive memory deficits noted.        Exercises Total Joint Exercises Ankle Circles/Pumps: AROM, Both, 5 reps Quad  Sets: AROM, Both, 5 reps, AAROM Heel Slides: AAROM, Both, 5 reps Hip ABduction/ADduction: AAROM, Both, 5 reps    General Comments        Pertinent Vitals/Pain Pain Assessment Pain Assessment: Faces Faces Pain Scale: Hurts even more Pain Location: R anterior thigh and groin; L hip Pain Descriptors / Indicators: Grimacing, Guarding, Sharp, Aching, Discomfort Pain Intervention(s): Limited activity within patient's tolerance, Monitored during session, Repositioned    Home Living                          Prior Function            PT Goals (current goals can now be found in the care plan section) Progress towards PT goals: Progressing toward goals    Frequency    Min 3X/week      PT Plan Current plan remains appropriate    Co-evaluation              AM-PAC PT "6 Clicks" Mobility   Outcome Measure  Help needed turning from your back to your side while in a flat bed without using bedrails?: Total Help needed moving from lying on your back to sitting on the side of a flat bed without using bedrails?: Total Help needed moving to and from a bed to a chair (including a wheelchair)?: Total Help needed standing up from a chair using your arms (e.g., wheelchair or bedside chair)?: Total Help needed to walk in hospital room?: Total Help needed climbing 3-5 steps with a railing? : Total 6 Click Score: 6    End of Session Equipment Utilized During Treatment: Gait belt Activity Tolerance: Patient tolerated treatment well;Patient limited by pain;Patient limited by fatigue Patient left: in chair;with call bell/phone within reach;with chair alarm set   PT Visit Diagnosis: Pain;Muscle weakness (generalized) (M62.81);Difficulty in walking, not elsewhere classified (R26.2);History of falling (Z91.81) Pain - part of body:  (R anterior thigh/groin areas; L hip)     Time: [id number] PT Time Calculation (min) (ACUTE ONLY): 25 min  Charges:  $Therapeutic Activity: 23-37  mins              Doreatha Massed, PT Acute Rehabilitation  Office: [phone]

## 2022-03-30 DIAGNOSIS — S72002A Fracture of unspecified part of neck of left femur, initial encounter for closed fracture: Secondary | ICD-10-CM | POA: Diagnosis not present

## 2022-03-30 DIAGNOSIS — D72829 Elevated white blood cell count, unspecified: Secondary | ICD-10-CM | POA: Diagnosis not present

## 2022-03-30 DIAGNOSIS — I4891 Unspecified atrial fibrillation: Secondary | ICD-10-CM | POA: Diagnosis not present

## 2022-03-30 DIAGNOSIS — Z96649 Presence of unspecified artificial hip joint: Secondary | ICD-10-CM | POA: Diagnosis not present

## 2022-03-30 LAB — CBC WITH DIFFERENTIAL/PLATELET
Abs Immature Granulocytes: 0.22 10*3/uL — ABNORMAL HIGH (ref 0.00–0.07)
Basophils Absolute: 0.1 10*3/uL (ref 0.0–0.1)
Basophils Relative: 0 %
Eosinophils Absolute: 0.5 10*3/uL (ref 0.0–0.5)
Eosinophils Relative: 5 %
HCT: 29.5 % — ABNORMAL LOW (ref 36.0–46.0)
Hemoglobin: 10.1 g/dL — ABNORMAL LOW (ref 12.0–15.0)
Immature Granulocytes: 2 %
Lymphocytes Relative: 17 %
Lymphs Abs: 1.9 10*3/uL (ref 0.7–4.0)
MCH: 34.8 pg — ABNORMAL HIGH (ref 26.0–34.0)
MCHC: 34.2 g/dL (ref 30.0–36.0)
MCV: 101.7 fL — ABNORMAL HIGH (ref 80.0–100.0)
Monocytes Absolute: 1.1 10*3/uL — ABNORMAL HIGH (ref 0.1–1.0)
Monocytes Relative: 10 %
Neutro Abs: 7.5 10*3/uL (ref 1.7–7.7)
Neutrophils Relative %: 66 %
Platelets: 262 10*3/uL (ref 150–400)
RBC: 2.9 MIL/uL — ABNORMAL LOW (ref 3.87–5.11)
RDW: 13 % (ref 11.5–15.5)
WBC: 11.2 10*3/uL — ABNORMAL HIGH (ref 4.0–10.5)
nRBC: 0 % (ref 0.0–0.2)

## 2022-03-30 LAB — GLUCOSE, CAPILLARY
Glucose-Capillary: 112 mg/dL — ABNORMAL HIGH (ref 70–99)
Glucose-Capillary: 129 mg/dL — ABNORMAL HIGH (ref 70–99)
Glucose-Capillary: 148 mg/dL — ABNORMAL HIGH (ref 70–99)
Glucose-Capillary: 132 mg/dL — ABNORMAL HIGH (ref 70–99)

## 2022-03-30 LAB — PHOSPHORUS: Phosphorus: 3.3 mg/dL (ref 2.5–4.6)

## 2022-03-30 LAB — MAGNESIUM: Magnesium: 2.1 mg/dL (ref 1.7–2.4)

## 2022-03-30 LAB — POTASSIUM: Potassium: 3.5 mmol/L (ref 3.5–5.1)

## 2022-03-30 MED ORDER — NYSTATIN 100000 UNIT/ML MT SUSP
5.0000 mL | Freq: Four times a day (QID) | OROMUCOSAL | Status: DC
  Administered 2022-03-30 – 2022-03-31 (×4): 500000 [IU] via ORAL
  Filled 2022-03-30 (×4): qty 5

## 2022-03-30 NOTE — TOC Progression Note (Signed)
Transition of Care [facility]) - Progression Note    Patient Details  Name: [name] Dodson MRN: [id number] Date of Birth: 1945/06/28  Transition of Care [facility]) CM/SW Contact  Servando Snare, [place] Phone Number: 03/30/2022, 12:49 PM  Clinical Narrative:    Spoke with patient at bedside. Patient is refusing SNF and wants to go home with [facility]. PT reports her daughter and grandson can help her. TOC spoke with patients daughter Kathlee Nations. Kathlee Nations reports patient can return home if she can walk. Kathlee Nations would like to see how patient works with PT tomorrow before making a decision [place] vs SNF. Bed offers where given to daughter and both of their preferences made a made offer. Daughter to discuss realistic dc plan with patient.   PLAN: [place] vs SNF    Expected Discharge Plan: [place] Barriers to Discharge: Continued Medical Work up  Expected Discharge Plan and Services   Discharge Planning Services: CM Consult Post Acute Care Choice: Home Health, Durable Medical Equipment Living arrangements for the past 2 months: Single Family Home                 DME Arranged: 3-N-1, Walker rolling DME Agency: [employer] Date DME Agency Contacted: 03/27/22 Time DME Agency Contacted: [phone] Representative spoke with at DME Agency: Erasmo Downer HH Arranged: PT, OT HH Agency: [place] Date [place]: 03/27/22 Time [place]: 1114 Representative spoke with at [place] at [place]: [place] Determinants of Health ([place]) Interventions SDOH Screenings   Food Insecurity: No Food Insecurity (03/24/2022)  Housing: Low Risk  (03/24/2022)  Transportation Needs: No Transportation Needs (03/24/2022)  Utilities: Not At Risk (03/24/2022)  Depression (PHQ2-9): Medium Risk (11/06/2021)  Tobacco Use: Medium Risk (03/25/2022)    Readmission Risk Interventions     No data to display

## 2022-03-30 NOTE — Progress Notes (Signed)
[name]                                                                               [name] Dodson, is a 77 y.o. female, DOB - March 20, 1945, [id number] Admit date - 03/24/2022    Outpatient Primary MD for the patient is [name], NP  LOS - 6  days    Brief summary   77 year old WF PMHx A-fib s/p DCCV 9/23 2016, dysarthria, chronic back pain, GERD, presents with left hip pain. X rays showed impacted femoral neck fracture.  Orthopedics consulted and she underwent left hemiarthroplasty.  Therapy eval recommending SNF.  No new complaints today. Still working with PT, with plans for SNF.     Assessment & Plan    Assessment and Plan:  Left hip fracture:  S/p left hemiarthroplasty on 2/19.  Pain control and therapy eval recommends SNF.   Due to persistent pain in the hip a CT hip was done.  CT hip does not show any periprosthetic hip fracture.  Right hip pain with PT. X rays of the right hip ordered. Unremarkable.  She is not participating much with PT due to pain. Currently she is on Oxycodone 10 to 15 mg daily for severe pain and on 5 to 10 mg for moderate pain.  Continue to monitor. Repeat therapy eval recommending SNF. Family agreeable.     Atrial fib , paroxysmal  Rate controlled.  NSR.  Not on anti coagulation.    GERD;  Stable on PPI.   Leukocytosis:  Unclear etiology, probably reactive.wbc minimal.  UA is negative.  CXR does not show any pneumonia.      Anemia of blood loss from the surgery.  Hemoglobin  stable 10.  Macrocytic.  Low normal vit [id number] and low folic acid levels. Replacements ordered.    Hypophosphatemia:  Replaced. Repeat levels wnl.    Constipation:  Added senna, colace, miralax and added dulcolax suppository.   RN Pressure Injury Documentation:    Malnutrition Type:  Nutrition Problem: Increased nutrient needs Etiology: post-op healing, hip fracture   Malnutrition Characteristics:  Signs/Symptoms:  estimated needs   Nutrition Interventions:  Interventions: Ensure Enlive (each supplement provides 350kcal and 20 grams of protein)  Estimated body mass index is 28.83 kg/m as calculated from the following:   Height as of this encounter: '5\' 8"'$  (1.727 m).   Weight as of this encounter: 86 kg.  Code Status: full code DVT Prophylaxis:  SCDs Start: 03/24/22 1809 Place TED hose Start: 03/24/22 1809 SCDs Start: 03/24/22 0840   Level of Care: Level of care: Med-Surg Family Communication: family at bedside.   Disposition Plan:     Remains inpatient appropriate:  discharge when able to tolerate pain and when bed available .   Procedures:  Left hemiarthroplasty on 2/19   Consultants:   Orthopedics.   Antimicrobials:   Anti-infectives (From admission, onward)    Start     Dose/Rate Route Frequency Ordered Stop   03/25/22 0600  ceFAZolin (ANCEF) IVPB 2g/100 mL premix        2 g 200 mL/hr over 30 Minutes Intravenous On call to O.R. 03/24/22 1220 03/25/22 [id number]  03/24/22 2000  ceFAZolin (ANCEF) IVPB 2g/100 mL premix        2 g 200 mL/hr over 30 Minutes Intravenous Every 6 hours 03/24/22 1808 03/25/22 1144   03/24/22 1224  ceFAZolin (ANCEF) 2-4 GM/100ML-% IVPB       Note to Pharmacy: Lysle Pearl: cabinet override      03/24/22 1224 03/24/22 1335        Medications  Scheduled Meds:  aspirin EC  325 mg Oral BID PC   vitamin B-12  1,000 mcg Oral Daily   docusate sodium  100 mg Oral BID   feeding supplement  237 mL Oral BID BM   ferrous sulfate  325 mg Oral BID WC   flecainide  100 mg Oral BID   folic acid  1 mg Oral Daily   insulin aspart  0-15 Units Subcutaneous TID WC   insulin aspart  0-5 Units Subcutaneous QHS   nystatin  5 mL Oral QID   pantoprazole  40 mg Oral Daily   polyethylene glycol  17 g Oral Daily   senna-docusate  2 tablet Oral BID   Continuous Infusions:  dextrose 5 % and 0.45% NaCl Stopped (03/25/22 1636)   methocarbamol (ROBAXIN) IV     PRN  Meds:.acetaminophen, alum & mag hydroxide-simeth, bisacodyl, calcium carbonate, magnesium citrate, menthol-cetylpyridinium **OR** phenol, methocarbamol **OR** methocarbamol (ROBAXIN) IV, metoCLOPramide **OR** metoCLOPramide (REGLAN) injection, ondansetron **OR** ondansetron (ZOFRAN) IV, oxyCODONE, oxyCODONE, zolpidem    Subjective:   [name] Dodson was seen and examined today. No new complaints. Still significant leg pain on ambulation.  Objective:   Vitals:   03/29/22 1431 03/29/22 2114 03/30/22 0457 03/30/22 1428  BP: 130/77 (!) 157/63 (!) 140/70 135/68  Pulse: 72 83 81 73  Resp: 18 (!) 21 20 (!) 21  Temp: 98.7 F (37.1 C) 99.5 F (37.5 C) 99.4 F (37.4 C) 98.6 F (37 C)  TempSrc: Oral Oral Oral Oral  SpO2:  90%  98%  Weight:      Height:        Intake/Output Summary (Last 24 hours) at 03/30/2022 1607 Last data filed at 03/30/2022 1146 Gross per 24 hour  Intake 120 ml  Output 1150 ml  Net -1030 ml    Filed Weights   03/24/22 0544 03/24/22 1204  Weight: 86.2 kg 86 kg     Exam General exam: Appears calm and comfortable  Respiratory system: Clear to auscultation. Respiratory effort normal. Cardiovascular system: S1 & S2 heard, RRR. No JVD,  No pedal edema. Gastrointestinal system: Abdomen is nondistended, soft and nontender.  Central nervous system: Alert and oriented. No focal neurological deficits. Extremities: painful ROM of the left hip.  Skin: No rashes,  Psychiatry: Mood & affect appropriate.       Data Reviewed:  I have personally reviewed following labs and imaging studies   CBC Lab Results  Component Value Date   WBC 11.2 (H) 03/30/2022   RBC 2.90 (L) 03/30/2022   HGB 10.1 (L) 03/30/2022   HCT 29.5 (L) 03/30/2022   MCV 101.7 (H) 03/30/2022   MCH 34.8 (H) 03/30/2022   PLT 262 03/30/2022   MCHC 34.2 03/30/2022   RDW 13.0 03/30/2022   LYMPHSABS 1.9 03/30/2022   MONOABS 1.1 (H) 03/30/2022   EOSABS 0.5 03/30/2022   BASOSABS 0.1 03/30/2022      Last metabolic panel Lab Results  Component Value Date   NA 135 03/29/2022   K 3.2 (L) 03/29/2022   CL 101 03/29/2022   CO2 26  03/29/2022   BUN 16 03/29/2022   CREATININE 0.62 03/29/2022   GLUCOSE 130 (H) 03/29/2022   GFRNONAA >60 03/29/2022   GFRAA >60 06/06/2016   CALCIUM 8.5 (L) 03/29/2022   PHOS 3.3 03/30/2022   PROT 5.3 (L) 03/29/2022   ALBUMIN 2.3 (L) 03/29/2022   BILITOT 1.2 03/29/2022   ALKPHOS 45 03/29/2022   AST 20 03/29/2022   ALT 16 03/29/2022   ANIONGAP 8 03/29/2022    CBG (last 3)  Recent Labs    03/30/22 0820 03/30/22 1202 03/30/22 1600  GLUCAP 112* 129* 148*       Coagulation Profile: No results for input(s): "INR", "PROTIME" in the last 168 hours.   Radiology Studies: No results found.     Hosie Poisson M.D. [name] 03/30/2022, 4:07 PM  Available via Epic secure chat 7am-7pm After 7 pm, please refer to night coverage provider listed on amion.

## 2022-03-30 NOTE — Plan of Care (Signed)
  Problem: Clinical Measurements: Goal: Diagnostic test results will improve Outcome: Progressing Goal: Respiratory complications will improve Outcome: Progressing Goal: Cardiovascular complication will be avoided Outcome: Progressing   Problem: Pain Managment: Goal: General experience of comfort will improve Outcome: Progressing   Problem: Safety: Goal: Ability to remain free from injury will improve Outcome: Progressing   

## 2022-03-30 NOTE — Plan of Care (Signed)
  Problem: Clinical Measurements: Goal: Will remain free from infection Outcome: Progressing   Problem: Pain Managment: Goal: General experience of comfort will improve Outcome: Progressing   Problem: Safety: Goal: Ability to remain free from injury will improve Outcome: Progressing   Problem: Clinical Measurements: Goal: Postoperative complications will be avoided or minimized Outcome: Progressing   Problem: Activity: Goal: Risk for activity intolerance will decrease Outcome: Not Progressing

## 2022-03-30 NOTE — Progress Notes (Signed)
Physical Therapy Treatment Patient Details Name: [name] Dodson MRN: [id number] DOB: 1946-01-15 Today's Date: 03/30/2022   History of Present Illness 77 yo female admitted with L hip fx after falling at home. Pt is s/p L hip hemiarthroplasty-anterior approach 03/24/22. Hx of R THA, Afib, chronic pain, sciatica, back pain, dyspnea, lumbar decompression 2018    PT Comments    Family present so decided to have a family education + practice session. 2 family members (daughter and son-n law?) practiced mobilizing pt to EOB and standing at bedside. Pt continues to report significant pain in bil LEs with activity. She was able to briefly stand again on today but not without great difficulty. She is still unable to take any steps (pivot or ambulatory). Discussed PT recommendations with patient and family. Encouraged pt strongly consider ST SNF placement.      Recommendations for follow up therapy are one component of a multi-disciplinary discharge planning process, led by the attending physician.  Recommendations may be updated based on patient status, additional functional criteria and insurance authorization.  Follow Up Recommendations  Skilled nursing-short term rehab (<3 hours/day) Can patient physically be transported by private vehicle: No   Assistance Recommended at Discharge Frequent or constant Supervision/Assistance  Patient can return home with the following Assistance with cooking/housework;Assist for transportation;Help with stairs or ramp for entrance;Two people to help with walking and/or transfers;Two people to help with bathing/dressing/bathroom   Equipment Recommendations  TBD at next venue. If home, will need PTAR transport and possibly hoyer lift, wheelchair   Recommendations for Other Services OT consult     Precautions / Restrictions Precautions Precautions: Fall Restrictions Weight Bearing Restrictions: No Other Position/Activity Restrictions: WBAT     Mobility  Bed  Mobility Overal bed mobility: Needs Assistance Bed Mobility: Supine to Sit     Supine to sit: HOB elevated, Max assist, +2 for physical assistance, +2 for safety/equipment Sit to supine: HOB elevated, +2 for physical assistance, +2 for safety/equipment, Max assist   General bed mobility comments: Family education and practice session. Multimodal cueing required. Assist for trunk and bil LEs. Utilized bedpad to assist with scooting, positioning at EOB. Increased time. Mod encouragement.    Transfers Overall transfer level: Needs assistance Equipment used: Rolling walker (2 wheels) Transfers: Sit to/from Stand Sit to Stand: Max assist, +2 physical assistance, +2 safety/equipment, From elevated surface           General transfer comment: Family education and practice session. Highly elevated bed surface. Sit to stand x 3-pt stood for ~10 seconds on 2nd and 3rd trial. WBing is still quite painful bilaterally. Multimodal cueing and encouragment required. 1st trial: attempted side by side with family only-pt unable to fully stand. 2nd trial: bear hug with pt holding on to family member's arms for support (family member standing in front of her)-total assist. 3rd trial: stood with +3 assist-one person stabilizing walker, 2 people (one on each side) lifting with bedpad. Did not attempt any steps-standing only at bedside.    Ambulation/Gait               General Gait Details: not yet ready   Stairs             Wheelchair Mobility    Modified Rankin (Stroke Patients Only)       Balance Overall balance assessment: Needs assistance         Standing balance support: Bilateral upper extremity supported, Reliant on assistive device for balance Standing balance-Leahy Scale: Poor  Cognition Arousal/Alertness: Awake/alert Behavior During Therapy: WFL for tasks assessed/performed Overall Cognitive Status: History of cognitive  impairments - at baseline Area of Impairment: Problem solving                 Orientation Level: Disoriented to, Time   Memory: Decreased short-term memory   Safety/Judgement: Decreased awareness of safety, Decreased awareness of deficits   Problem Solving: Requires verbal cues, Requires tactile cues, Difficulty sequencing General Comments: Participated well. Multimodal cues. Anxious. Mild cognitive memory deficits noted. Poor insight.        Exercises General Exercises - Lower Extremity Ankle Circles/Pumps: AROM, Both, 5 reps Quad Sets: AROM, Both, 5 reps (constant cues) Heel Slides: AAROM, Both, 5 reps    General Comments        Pertinent Vitals/Pain Pain Assessment Pain Assessment: Faces Faces Pain Scale: Hurts even more Pain Location: R anterior thigh and groin; L hip Pain Descriptors / Indicators: Grimacing, Guarding, Sharp, Aching, Discomfort Pain Intervention(s): Limited activity within patient's tolerance, Monitored during session, Repositioned    Home Living                          Prior Function            PT Goals (current goals can now be found in the care plan section) Progress towards PT goals: Progressing toward goals    Frequency    Min 3X/week      PT Plan Current plan remains appropriate    Co-evaluation              AM-PAC PT "6 Clicks" Mobility   Outcome Measure  Help needed turning from your back to your side while in a flat bed without using bedrails?: Total Help needed moving from lying on your back to sitting on the side of a flat bed without using bedrails?: Total Help needed moving to and from a bed to a chair (including a wheelchair)?: Total Help needed standing up from a chair using your arms (e.g., wheelchair or bedside chair)?: Total Help needed to walk in hospital room?: Total Help needed climbing 3-5 steps with a railing? : Total 6 Click Score: 6    End of Session Equipment Utilized During  Treatment: Gait belt Activity Tolerance: Patient tolerated treatment well;Patient limited by pain;Patient limited by fatigue Patient left: in bed;with call bell/phone within reach;with family/visitor present   PT Visit Diagnosis: Pain;Muscle weakness (generalized) (M62.81);Difficulty in walking, not elsewhere classified (R26.2);History of falling (Z91.81)     Time: 1435-1510 PT Time Calculation (min) (ACUTE ONLY): 35 min  Charges:  $Therapeutic Activity: 23-37 mins                         Doreatha Massed, PT Acute Rehabilitation  Office: [phone]

## 2022-03-31 DIAGNOSIS — K219 Gastro-esophageal reflux disease without esophagitis: Secondary | ICD-10-CM | POA: Diagnosis not present

## 2022-03-31 DIAGNOSIS — I4891 Unspecified atrial fibrillation: Secondary | ICD-10-CM | POA: Diagnosis not present

## 2022-03-31 DIAGNOSIS — M545 Low back pain, unspecified: Secondary | ICD-10-CM | POA: Diagnosis not present

## 2022-03-31 DIAGNOSIS — Z96649 Presence of unspecified artificial hip joint: Secondary | ICD-10-CM | POA: Diagnosis not present

## 2022-03-31 LAB — CBC WITH DIFFERENTIAL/PLATELET
Abs Immature Granulocytes: 0.3 10*3/uL — ABNORMAL HIGH (ref 0.00–0.07)
Basophils Absolute: 0.1 10*3/uL (ref 0.0–0.1)
Basophils Relative: 1 %
Eosinophils Absolute: 0.7 10*3/uL — ABNORMAL HIGH (ref 0.0–0.5)
Eosinophils Relative: 6 %
HCT: 30.3 % — ABNORMAL LOW (ref 36.0–46.0)
Hemoglobin: 10.3 g/dL — ABNORMAL LOW (ref 12.0–15.0)
Immature Granulocytes: 3 %
Lymphocytes Relative: 23 %
Lymphs Abs: 2.7 10*3/uL (ref 0.7–4.0)
MCH: 34.6 pg — ABNORMAL HIGH (ref 26.0–34.0)
MCHC: 34 g/dL (ref 30.0–36.0)
MCV: 101.7 fL — ABNORMAL HIGH (ref 80.0–100.0)
Monocytes Absolute: 1.1 10*3/uL — ABNORMAL HIGH (ref 0.1–1.0)
Monocytes Relative: 9 %
Neutro Abs: 7 10*3/uL (ref 1.7–7.7)
Neutrophils Relative %: 58 %
Platelets: 294 10*3/uL (ref 150–400)
RBC: 2.98 MIL/uL — ABNORMAL LOW (ref 3.87–5.11)
RDW: 13.1 % (ref 11.5–15.5)
WBC: 11.9 10*3/uL — ABNORMAL HIGH (ref 4.0–10.5)
nRBC: 0 % (ref 0.0–0.2)

## 2022-03-31 LAB — GLUCOSE, CAPILLARY
Glucose-Capillary: 114 mg/dL — ABNORMAL HIGH (ref 70–99)
Glucose-Capillary: 151 mg/dL — ABNORMAL HIGH (ref 70–99)

## 2022-03-31 LAB — MAGNESIUM: Magnesium: 2.3 mg/dL (ref 1.7–2.4)

## 2022-03-31 LAB — PHOSPHORUS: Phosphorus: 3.2 mg/dL (ref 2.5–4.6)

## 2022-03-31 MED ORDER — DIPHENHYDRAMINE-ZINC ACETATE 2-0.1 % EX CREA
TOPICAL_CREAM | Freq: Three times a day (TID) | CUTANEOUS | Status: DC | PRN
  Filled 2022-03-31: qty 28

## 2022-03-31 MED ORDER — ASPIRIN 325 MG PO TBEC: 325.0000 mg | DELAYED_RELEASE_TABLET | Freq: Two times a day (BID) | ORAL | 0 refills | Status: DC

## 2022-03-31 MED ORDER — ENSURE ENLIVE PO LIQD: 237.0000 mL | Freq: Two times a day (BID) | ORAL | 12 refills | Status: DC

## 2022-03-31 MED ORDER — DIPHENHYDRAMINE HCL 25 MG PO CAPS: 25.0000 mg | ORAL_CAPSULE | Freq: Three times a day (TID) | ORAL | 0 refills | Status: DC | PRN

## 2022-03-31 MED ORDER — DIPHENHYDRAMINE-ZINC ACETATE 2-0.1 % EX CREA: TOPICAL_CREAM | Freq: Three times a day (TID) | CUTANEOUS | 0 refills | Status: DC | PRN

## 2022-03-31 MED ORDER — HYDROCORTISONE 1 % EX CREA
TOPICAL_CREAM | Freq: Three times a day (TID) | CUTANEOUS | Status: DC
  Filled 2022-03-31: qty 28

## 2022-03-31 MED ORDER — BISACODYL 10 MG RE SUPP: 10.0000 mg | Freq: Every day | RECTAL | 0 refills | Status: DC | PRN

## 2022-03-31 MED ORDER — FOLIC ACID 1 MG PO TABS: 1.0000 mg | ORAL_TABLET | Freq: Every day | ORAL | Status: DC

## 2022-03-31 MED ORDER — FERROUS SULFATE 325 (65 FE) MG PO TABS: 325.0000 mg | ORAL_TABLET | Freq: Two times a day (BID) | ORAL | 3 refills | Status: DC

## 2022-03-31 MED ORDER — HYDROCODONE-ACETAMINOPHEN 5-325 MG PO TABS: 1.0000 | ORAL_TABLET | Freq: Four times a day (QID) | ORAL | 0 refills | Status: AC | PRN

## 2022-03-31 MED ORDER — DIPHENHYDRAMINE HCL 25 MG PO CAPS: 25.0000 mg | ORAL_CAPSULE | Freq: Three times a day (TID) | ORAL | Status: DC | PRN

## 2022-03-31 MED ORDER — HYDROCORTISONE 1 % EX CREA: TOPICAL_CREAM | Freq: Three times a day (TID) | CUTANEOUS | 0 refills | Status: DC

## 2022-03-31 MED ORDER — NYSTATIN 100000 UNIT/ML MT SUSP: 5.0000 mL | Freq: Four times a day (QID) | OROMUCOSAL | 0 refills | Status: DC

## 2022-03-31 MED ORDER — POLYETHYLENE GLYCOL 3350 17 G PO PACK: 17.0000 g | PACK | Freq: Every day | ORAL | 0 refills | Status: DC | PRN

## 2022-03-31 MED ORDER — DOCUSATE SODIUM 100 MG PO CAPS: 100.0000 mg | ORAL_CAPSULE | Freq: Two times a day (BID) | ORAL | 0 refills | Status: DC

## 2022-03-31 MED ORDER — SENNOSIDES-DOCUSATE SODIUM 8.6-50 MG PO TABS: 2.0000 | ORAL_TABLET | Freq: Every evening | ORAL | Status: DC | PRN

## 2022-03-31 MED ORDER — CYANOCOBALAMIN 1000 MCG PO TABS: 1000.0000 ug | ORAL_TABLET | Freq: Every day | ORAL | Status: DC

## 2022-03-31 NOTE — Discharge Summary (Signed)
Physician Discharge Summary   Patient: [name] Dodson MRN: [id number] DOB: December 05, 1945  Admit date:     03/24/2022  Discharge date: 03/31/22  Discharge Physician: Hosie Poisson   PCP: [name], NP   Recommendations at discharge:  Please follow up with orthopedics as recommended.  Please follow up with cbc and Bmp in one week.  Please follow up with PCP in one week.   Discharge Diagnoses: Principal Problem:   Status post hip hemiarthroplasty Active Problems:   Atrial fibrillation, unspecified type (HCC)   Gastroesophageal reflux disease   Closed left hip fracture, initial encounter ([place])   Chronic back pain   Cerebral ventriculomegaly   Leukocytosis   Polycythemia   Hyperglycemia   Hospital Course: 77 year old WF PMHx A-fib s/p DCCV 9/23 2016, dysarthria, chronic back pain, GERD, presents with left hip pain. X rays showed impacted femoral neck fracture.  Orthopedics consulted and she underwent left hemiarthroplasty.  Therapy eval recommending SNF.  No new complaints today. Still working with PT, with plans for SNF.     Assessment and Plan: Left hip fracture:  S/p left hemiarthroplasty on 2/19.  Pain control and therapy eval recommends SNF.   Due to persistent pain in the hip a CT hip was done.  CT hip does not show any periprosthetic hip fracture.  Right hip pain with PT. X rays of the right hip ordered. Unremarkable.  She is not participating much with PT due to pain. Currently she is on Oxycodone 10 to 15 mg daily for severe pain and on 5 to 10 mg for moderate pain.  Continue to monitor. Repeat therapy eval recommending SNF. Family agreeable.       Atrial fib , paroxysmal  Rate controlled.  NSR.  Not on anti coagulation.      GERD;  Stable on PPI.    Leukocytosis:  Unclear etiology, probably reactive.wbc minimal.  UA is negative.  CXR does not show any pneumonia.          Anemia of blood loss from the surgery.  Hemoglobin  stable 10.  Macrocytic.   Low normal vit [id number] and low folic acid levels. Replacements ordered.      Hypophosphatemia:  Replaced. Repeat levels wnl.      Constipation:  Added senna, colace, miralax and added dulcolax suppository.    RN Pressure Injury Documentation:   Malnutrition Type:   Nutrition Problem: Increased nutrient needs Etiology: post-op healing, hip fracture     Malnutrition Characteristics:   Signs/Symptoms: estimated needs     Nutrition Interventions:   Interventions: Ensure Enlive (each supplement provides 350kcal and 20 grams of protein)   Estimated body mass index is 28.83 kg/m as calculated from the following:   Height as of this encounter: '5\' 8"'$  (1.727 m).   Weight as of this encounter: 86 kg.          Consultants: orthopedics.  Procedures performed:  S/p left hemiarthroplasty on 2/19.  Disposition: Home Diet recommendation:  Regular diet DISCHARGE MEDICATION: Allergies as of 03/31/2022       Reactions   Chlorhexidine Rash   Red rash and burning        Medication List     STOP taking these medications    ibuprofen 600 MG tablet Commonly known as: ADVIL   traMADol 50 MG tablet Commonly known as: ULTRAM       TAKE these medications    aspirin EC 325 MG tablet Take 1 tablet (325 mg total) by mouth 2 (  two) times daily after a meal.   benzonatate 100 MG capsule Commonly known as: TESSALON Take 1 capsule (100 mg total) by mouth 3 (three) times daily as needed for cough.   bisacodyl 10 MG suppository Commonly known as: DULCOLAX Place 1 suppository (10 mg total) rectally daily as needed for moderate constipation.   calcium carbonate 500 MG chewable tablet Commonly known as: TUMS - dosed in mg elemental calcium Chew 1 tablet by mouth daily as needed for indigestion or heartburn.   cyanocobalamin 1000 MCG tablet Take 1 tablet (1,000 mcg total) by mouth daily. Start taking on: April 01, 2022   diphenhydrAMINE 25 mg capsule Commonly known as:  BENADRYL Take 1 capsule (25 mg total) by mouth every 8 (eight) hours as needed for itching.   diphenhydrAMINE-zinc acetate cream Commonly known as: BENADRYL Apply topically 3 (three) times daily as needed for itching.   docusate sodium 100 MG capsule Commonly known as: COLACE Take 1 capsule (100 mg total) by mouth 2 (two) times daily.   feeding supplement Liqd Take 237 mLs by mouth 2 (two) times daily between meals.   ferrous sulfate 325 (65 FE) MG tablet Take 1 tablet (325 mg total) by mouth 2 (two) times daily with a meal.   flecainide 100 MG tablet Commonly known as: TAMBOCOR Take 100 mg by mouth 2 (two) times daily.   folic acid 1 MG tablet Commonly known as: FOLVITE Take 1 tablet (1 mg total) by mouth daily. Start taking on: April 01, 2022   HYDROcodone-acetaminophen 5-325 MG tablet Commonly known as: NORCO/VICODIN Take 1-2 tablets by mouth every 6 (six) hours as needed for moderate pain.   hydrocortisone cream 1 % Apply topically 3 (three) times daily.   nystatin 100000 UNIT/ML suspension Commonly known as: MYCOSTATIN Take 5 mLs (500,000 Units total) by mouth 4 (four) times daily.   omeprazole 20 MG capsule Commonly known as: PRILOSEC Take 1 capsule (20 mg total) by mouth daily. What changed:  when to take this reasons to take this   ondansetron 4 MG disintegrating tablet Commonly known as: ZOFRAN-ODT Take 1 tablet (4 mg total) by mouth every 8 (eight) hours as needed for nausea or vomiting.   polyethylene glycol 17 g packet Commonly known as: MIRALAX / GLYCOLAX Take 17 g by mouth daily as needed.   senna-docusate 8.6-50 MG tablet Commonly known as: Senokot-S Take 2 tablets by mouth at bedtime as needed for mild constipation.               Durable Medical Equipment  (From admission, onward)           Start     Ordered   03/27/22 1113  For home use only DME 3 n 1  Once        03/27/22 1112   03/27/22 1112  For home use only DME Walker  rolling  Once       Comments: Rolling walker 2 wheels per PT note.  Question Answer Comment  Walker: With [place]   Patient needs a walker to treat with the following condition Unsteady gait      03/27/22 1111              Discharge Care Instructions  (From admission, onward)           Start     Ordered   03/31/22 0000  Weight bearing as tolerated       Question Answer Comment  Laterality bilateral  Extremity Lower      03/31/22 1431            Contact information for follow-up providers     Dorna Leitz, MD. Schedule an appointment as soon as possible for a visit in 2 week(s).   Specialty: Orthopedic Surgery Contact information: [place] [address] [phone]         [name], NP. Schedule an appointment as soon as possible for a visit in 1 week(s).   Specialty: Internal Medicine Contact information: [place] [address] [phone]              Contact information for after-discharge care     Destination     [facility] .   Service: Skilled Nursing Contact information: [address] [place] [phone]                    Discharge Exam: Danley Danker Weights   03/24/22 0544 03/24/22 1204  Weight: 86.2 kg 86 kg   General exam: Appears calm and comfortable  Respiratory system: Clear to auscultation. Respiratory effort normal. Cardiovascular system: S1 & S2 heard, RRR. No JVD,  Gastrointestinal system: Abdomen is nondistended, soft and nontender.  Central nervous system: Alert and oriented. No focal neurological deficits. Extremities: Symmetric 5 x 5 power. Skin: No rashes,  Psychiatry: Mood & affect appropriate.    Condition at discharge: fair  The results of significant diagnostics from this hospitalization (including imaging, microbiology, ancillary and laboratory) are listed below for reference.   Imaging Studies: DG HIP  UNILAT WITH PELVIS 2-3 VIEWS RIGHT  Result Date: 03/27/2022 CLINICAL DATA:  Hip pain. EXAM: DG HIP (WITH OR WITHOUT PELVIS) 2-3V RIGHT COMPARISON:  Bilateral hip radiographs 03/24/2022, CT left hip 03/26/2022 FINDINGS: Status post bilateral total hip arthroplasty. No perihardware lucency is seen to indicate hardware failure or loosening. Recent left hip postoperative lateral subcutaneous air. A tiny cortical fragment overlying the lateral proximal femoral metadiaphysis is seen to be within the vastus lateralis muscle on yesterday's CT. Diffuse decreased bone mineralization.  No acute fracture is seen. L3-4 posterior rod and screw fusion hardware with associated intervertebral disc spacer. IMPRESSION: Status post bilateral total hip arthroplasty without evidence of hardware failure. Electronically Signed   By: Yvonne Kendall M.D.   On: 03/27/2022 13:33   CT HIP LEFT WO CONTRAST  Result Date: 03/27/2022 CLINICAL DATA:  Hip replacement, periprosthetic fracture suspected EXAM: CT OF THE LEFT HIP WITHOUT CONTRAST TECHNIQUE: Multidetector CT imaging of the left hip was performed according to the standard protocol. Multiplanar CT image reconstructions were also generated. RADIATION DOSE REDUCTION: This exam was performed according to the departmental dose-optimization program which includes automated exposure control, adjustment of the mA and/or kV according to patient size and/or use of iterative reconstruction technique. COMPARISON:  03/24/2022 x-ray FINDINGS: Bones/Joint/Cartilage Postsurgical changes following recent left hip hemiarthroplasty. Arthroplasty components are in their expected alignment. No periprosthetic lucency or fracture. Visualized portion of the left hemipelvis is intact without fracture or diastasis. The bones are demineralized. No focal lytic or sclerotic bone lesion. A small vascular channel is noted along the posteromedial cortex of the proximal femoral diaphysis (series 9, image 64).  Ligaments Suboptimally assessed by CT. Muscles and Tendons A 1.1 x 0.4 x 0.6 cm bony fragment is seen within the proximal left vastus lateralis muscle belly at the anterior aspect of the proximal thigh (series 7, image 45). Soft tissue air within the  anterior compartment musculature related to surgical incision. No well-defined intramuscular hematoma. 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 Soft tissues Soft tissue edema and ill-defined fluid overlying the anterolateral aspect of the proximal hip and thigh compatible with recent postsurgical change. No organized fluid collection or hematoma. No left inguinal lymphadenopathy. Vascular calcifications are present. IMPRESSION: 1. Postsurgical changes following recent left hip hemiarthroplasty. No periprosthetic lucency or fracture. 2. A 1.1 x 0.4 x 0.6 cm bony fragment is seen within the proximal left vastus lateralis muscle belly at the anterior aspect of the proximal thigh. 3. 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 Electronically Signed   By: Davina Poke D.O.   On: 03/27/2022 08:44   Pelvis Portable  Result Date: 03/24/2022 CLINICAL DATA:  Status post left total hip arthroplasty EXAM: PORTABLE PELVIS 1-2 VIEWS COMPARISON:  03/24/2022, 5:50 a.m. FINDINGS: Patient has undergone a previous right hip arthroplasty and there has been an interval left hip arthroplasty with grossly anatomic alignment. IMPRESSION: Status post bilateral hip arthroplasty. Electronically Signed   By: Sammie Bench M.D.   On: 03/24/2022 16:47   DG HIP UNILAT WITH PELVIS 1V LEFT  Result Date: 03/24/2022 CLINICAL DATA:  Hip replacement EXAM: DG HIP (WITH OR WITHOUT PELVIS) 1V*L* COMPARISON:  03/24/2022 FINDINGS: Two low resolution intraoperative spot views of the left hip. Total fluoroscopy time 6 seconds,  fluoroscopic dose of 0.9362 mGy. Images demonstrate previous right hip replacement. There is interval left hip replacement with normal alignment. IMPRESSION: Intraoperative fluoroscopic assistance provided during left hip replacement. Electronically Signed   By: Donavan Foil M.D.   On: 03/24/2022 15:29   DG C-Arm 1-60 Min-No Report  Result Date: 03/24/2022 Fluoroscopy was utilized by the requesting physician.  No radiographic interpretation.   DG C-Arm 1-60 Min-No Report  Result Date: 03/24/2022 Fluoroscopy was utilized by the requesting physician.  No radiographic interpretation.   CT HEAD WO CONTRAST (5MM)  Result Date: 03/24/2022 CLINICAL DATA:  Fall last night.  Head trauma EXAM: CT HEAD WITHOUT CONTRAST TECHNIQUE: Contiguous axial images were obtained from the base of the skull through the vertex without intravenous contrast. RADIATION DOSE REDUCTION: This exam was performed according to the departmental dose-optimization program which includes automated exposure control, adjustment of the mA and/or kV according to patient size and/or use of iterative reconstruction technique. COMPARISON:  04/21/2003 FINDINGS: Brain: No evidence of acute infarction, hemorrhage, obstructive hydrocephalus, extra-axial collection or mass lesion/mass effect. Ventriculomegaly primarily attributed to brain atrophy although there is some disproportionate subarachnoid spaces with crowding at the vertex and callosum angle narrowing. Moderate chronic small vessel ischemia in the cerebral white matter. Vascular: No hyperdense vessel or unexpected calcification. Skull: Normal. Negative for fracture or focal lesion. Sinuses/Orbits: No acute finding. IMPRESSION: 1. No evidence of intracranial injury. 2. Ventriculomegaly with brain atrophy. 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 Electronically Signed   By: Jorje Guild M.D.   On: 03/24/2022 06:53   DG Chest 1  View  Result Date: 03/24/2022 CLINICAL DATA:  Fall EXAM: CHEST  1 VIEW COMPARISON:  11/10/2018 FINDINGS: Generous heart size and vascular pedicle width accentuated by technique and rotation. Generalized interstitial coarsening is stable. There is no edema, consolidation, effusion, or pneumothorax. No acute fracture detected. IMPRESSION: Low volume chest without acute finding. Electronically Signed   By: Jorje Guild  M.D.   On: 03/24/2022 06:26   DG Hips Bilat W or Wo Pelvis 3-4 Views  Result Date: 03/24/2022 CLINICAL DATA:  Fall EXAM: DG HIP (WITH OR WITHOUT PELVIS) 4V BILAT COMPARISON:  None Available. FINDINGS: Impacted left femoral neck fracture, subcapital. Hemiarthroplasty on the right which is unremarkable. Intact appearance of the pelvic ring. Generalized osteopenia. IMPRESSION: Impacted left femoral neck fracture. Electronically Signed   By: Jorje Guild M.D.   On: 03/24/2022 06:26    Microbiology: Results for orders placed or performed during the hospital encounter of 03/24/22  Surgical pcr screen     Status: None   Collection Time: 03/24/22 12:12 PM   Specimen: Nasal Mucosa; Nasal Swab  Result Value Ref Range Status   MRSA, PCR NEGATIVE NEGATIVE Final   Staphylococcus aureus NEGATIVE NEGATIVE Final    Comment: (NOTE) The Xpert SA Assay (FDA approved for NASAL specimens in patients 108 years of age and older), is one component of a comprehensive surveillance program. It is not intended to diagnose infection nor to guide or monitor treatment. Performed at [facility], [place] [address], [place], [place] [address]     Labs: CBC: Recent Labs  Lab 03/27/22 [phone] 03/28/22 0452 03/29/22 0553 03/30/22 0527 03/31/22 0425  WBC 11.2* 10.7* 9.9 11.2* 11.9*  NEUTROABS 7.7 6.9 6.8 7.5 7.0  HGB 10.6* 10.3* 10.2* 10.1* 10.3*  HCT 30.3* 29.8* 28.5* 29.5* 30.3*  MCV 101.3* 102.1* 99.3 101.7* 101.7*  PLT 143* 176 211 262 [id number]   Basic Metabolic Panel: Recent Labs   Lab 03/25/22 0526 03/26/22 0459 03/27/22 0512 03/28/22 0452 03/29/22 0553 03/30/22 0527 03/30/22 1700 03/31/22 0425  NA 134* 135 136 134* 135  --   --   --   K 3.9 4.6 3.9 3.5 3.2*  --  3.5  --   CL 102 103 101 99 101  --   --   --   CO2 '24 24 26 25 26  '$ --   --   --   GLUCOSE 202* 114* 125* 120* 130*  --   --   --   BUN '20 18 18 20 16  '$ --   --   --   CREATININE 0.80 0.68 0.63 0.49 0.62  --   --   --   CALCIUM 8.9 8.7* 8.5* 8.5* 8.5*  --   --   --   MG 2.1 1.9 2.3 2.0 2.1 2.1  --  2.3  PHOS 3.1 2.4* 3.6 2.9 3.1 3.3  --  3.2   Liver Function Tests: Recent Labs  Lab 03/25/22 0526 03/26/22 0459 03/27/22 0512 03/28/22 0452 03/29/22 0553  AST 32 '29 28 20 20  '$ ALT '20 15 15 17 16  '$ ALKPHOS 42 41 42 45 45  BILITOT 0.8 1.1 1.1 1.1 1.2  PROT 5.7* 5.7* 5.6* 5.6* 5.3*  ALBUMIN 3.1* 3.1* 3.0* 2.6* 2.3*   CBG: Recent Labs  Lab 03/30/22 1202 03/30/22 1600 03/30/22 2024 03/31/22 0755 03/31/22 1231  GLUCAP 129* 148* 132* 114* 151*    Discharge time spent: 39 minutes.   Signed: Hosie Poisson, MD [facility] 03/31/2022

## 2022-03-31 NOTE — Care Management Important Message (Signed)
Important Message  Patient Details IM Letter given. Name: [name] Dodson MRN: [id number] Date of Birth: 12-07-1945   Medicare Important Message Given:  Yes     Kerin Salen 03/31/2022, 1:35 PM

## 2022-03-31 NOTE — TOC Progression Note (Addendum)
Transition of Care [facility]) - Progression Note    Patient Details  Name: [name] Dodson MRN: [id number] Date of Birth: [birth year]  Transition of Care [facility]) CM/SW [place], LCSW Phone Number: 03/31/2022, 10:19 AM  Clinical Narrative:    CSW spoke with pt's daughter she reported she would like to see how her mother does with PT today before she makes a decision SNF vs HH. TOC to follow.   [place]  12:20pm CSW spoke with both pt and pt's daughter they have agreed to SNF placement. Pt and family has chosen [facility]. CSW reached out to [place] with [facility] to confirm bed availability. She reported she will speak with family to fill out paperwork.   TOC to follow  Expected Discharge Plan: [place] Barriers to Discharge: Continued Medical Work up  Expected Discharge Plan and Services   Discharge Planning Services: CM Consult Post Acute Care Choice: Home Health, Durable Medical Equipment Living arrangements for the past 2 months: Single Family Home                 DME Arranged: 3-N-1, [place] rolling DME Agency: [employer] Date DME Agency Contacted: 03/27/22 Time DME Agency Contacted: [phone] Representative spoke with at DME Agency: [facility] HH Arranged: PT, OT HH Agency: [place] Date [place]: 03/27/22 Time [place]: 1114 Representative spoke with at [place]: [place] Determinants of Health ([place]) Interventions SDOH Screenings   Food Insecurity: No Food Insecurity (03/24/2022)  Housing: Low Risk  (03/24/2022)  Transportation Needs: No Transportation Needs (03/24/2022)  Utilities: Not At Risk (03/24/2022)  Depression (PHQ2-9): Medium Risk (11/06/2021)  Tobacco Use: Medium Risk (03/25/2022)    Readmission Risk Interventions     No data to display

## 2022-03-31 NOTE — TOC Transition Note (Signed)
Transition of Care [facility]) - CM/SW Discharge Note   Patient Details  Name: [name] Dodson MRN: [id number] Date of Birth: 1945/09/22  [facility]) CM/SW Contact:  Illene Regulus, LCSW Phone Number: 03/31/2022, 3:10 PM   Clinical Narrative:    Pt to d/c to [facility], CSW spoke with pt's daughter , who is requesting EMS transport. Pt's room assignment [place], Call report [phone]. PTAR called. No additional TOC needs , TOC sign off.   Final next level of care: Skilled Nursing Facility Barriers to Discharge: No Barriers Identified   Patient Goals and CMS Choice CMS Medicare.gov Compare Post Acute Care list provided to:: Patient Choice offered to / list presented to : Patient  Discharge Placement                Patient chooses bed at: [facility] Patient to be transferred to facility by: EMS Name of family member notified: Hulan Fess ( Patient and family notified of of transfer: 03/31/22  Discharge Plan and Services Additional resources added to the After Visit Summary for     Discharge Planning Services: CM Consult Post Acute Care Choice: Home Health, Durable Medical Equipment          DME Arranged: 3-N-1, Walker rolling DME Agency: [employer] Date DME Agency Contacted: 03/27/22 Time DME Agency Contacted: [phone] Representative spoke with at DME Agency: [facility] HH Arranged: PT, OT HH Agency: [place] Date [place]: 03/27/22 Time [place]: 1114 Representative spoke with at [place]: [place] Determinants of Health ([place]) Interventions SDOH Screenings   Food Insecurity: No Food Insecurity (03/24/2022)  Housing: Low Risk  (03/24/2022)  Transportation Needs: No Transportation Needs (03/24/2022)  Utilities: Not At Risk (03/24/2022)  Depression (PHQ2-9): Medium Risk (11/06/2021)  Tobacco Use: Medium Risk (03/25/2022)     Readmission Risk Interventions     No data to display

## 2022-03-31 NOTE — Progress Notes (Addendum)
Physical Therapy Treatment Patient Details Name: [name] Dodson MRN: [id number] DOB: December 28, 1945 Today's Date: 03/31/2022   History of Present Illness 77 yo female admitted with L hip fx after falling at home. Pt is s/p L hip hemiarthroplasty-anterior approach 03/24/22. Hx of R THA, Afib, chronic pain, sciatica, back pain, dyspnea, lumbar decompression 2018    PT Comments    Pt agreeable to participate with encouragement. She continues to have general confusion, impaired memory, and poor insight regarding her physical abilities/deficits. Family was present to observe on today. Pt continues to c/o R anterior thigh/groin pain in addition to post op L hip pain. Multimodal cueing required for all mobility tasks. Continue to strongly encourage ST SNF rehab. If pt/family refuses placement, will need HHPT and 24/7 supervision/assist at home.  Also pt has rash on her back that appears to have worsened (covering larger area compared to yesterday)-pt c/o significant irritation. RN aware. Encouraged family to continue discussion with MD.    Recommendations for follow up therapy are one component of a multi-disciplinary discharge planning process, led by the attending physician.  Recommendations may be updated based on patient status, additional functional criteria and insurance authorization.  Follow Up Recommendations  Skilled nursing-short term rehab (<3 hours/day) Can patient physically be transported by private vehicle: No   Assistance Recommended at Discharge Frequent or constant Supervision/Assistance  Patient can return home with the following Assistance with cooking/housework;Assist for transportation;Help with stairs or ramp for entrance;Two people to help with walking and/or transfers;Two people to help with bathing/dressing/bathroom   Equipment Recommendations   (If home, will need PTAR transport. Also possibly hoyer lift and wheelchair)    Recommendations for Other Services OT consult      Precautions / Restrictions Precautions Precautions: Fall Restrictions Weight Bearing Restrictions: No Other Position/Activity Restrictions: WBAT     Mobility  Bed Mobility Overal bed mobility: Needs Assistance Bed Mobility: Supine to Sit     Supine to sit: Max assist, +2 for physical assistance, +2 for safety/equipment, HOB elevated     General bed mobility comments: Assist for trunk and bil LEs. Utilized bedpad for scooting, positioning EOB. Increased time. Multimodal cueing required.    Transfers Overall transfer level: Needs assistance Equipment used: Rolling walker (2 wheels) Transfers: Sit to/from Stand Sit to Stand: Mod assist, +2 physical assistance, +2 safety/equipment, From elevated surface           General transfer comment: x3 (x2 from elevated bed, x1 from high STEDY seat). Max encouragement to get pt to stand for as long as she could-today was for ~15-20 seconds. Assist to power up, stabilize, facilitate trunk/hip/knee extension. She is easily distracted by the pain. Multimodal cueing required.    Ambulation/Gait               General Gait Details: not yet ready   Stairs             Wheelchair Mobility    Modified Rankin (Stroke Patients Only)       Balance Overall balance assessment: Needs assistance     Sitting balance - Comments: leans to R side to unweight L hip. also posterior lean   Standing balance support: Bilateral upper extremity supported, Reliant on assistive device for balance, During functional activity Standing balance-Leahy Scale: Poor                              Cognition Arousal/Alertness: Awake/alert Behavior During Therapy: [facility] for  tasks assessed/performed   Area of Impairment: Problem solving                 Orientation Level: Disoriented to, Time   Memory: Decreased short-term memory   Safety/Judgement: Decreased awareness of safety, Decreased awareness of deficits   Problem  Solving: Requires verbal cues, Requires tactile cues, Difficulty sequencing General Comments: Participated well. Multimodal cues. Anxious. Poor insight. Generally confused.        Exercises Total Joint Exercises Ankle Circles/Pumps: AROM, Both, 5 reps Quad Sets: AROM, Both, 5 reps Heel Slides: AAROM, Both, 5 reps Hip ABduction/ADduction: AAROM, Both, 5 reps Long Arc Quad: AROM, Both, 10 reps, Seated    General Comments        Pertinent Vitals/Pain Pain Assessment Pain Assessment: Faces Faces Pain Scale: Hurts even more Pain Location: R anterior thigh and groin; L hip Pain Descriptors / Indicators: Grimacing, Guarding, Sharp, Aching, Discomfort Pain Intervention(s): Limited activity within patient's tolerance, Monitored during session, Repositioned    Home Living                          Prior Function            PT Goals (current goals can now be found in the care plan section) Progress towards PT goals: Progressing toward goals    Frequency    Min 3X/week      PT Plan Current plan remains appropriate    Co-evaluation              AM-PAC PT "6 Clicks" Mobility   Outcome Measure  Help needed turning from your back to your side while in a flat bed without using bedrails?: A Lot Help needed moving from lying on your back to sitting on the side of a flat bed without using bedrails?: Total Help needed moving to and from a bed to a chair (including a wheelchair)?: Total Help needed standing up from a chair using your arms (e.g., wheelchair or bedside chair)?: Total Help needed to walk in hospital room?: Total Help needed climbing 3-5 steps with a railing? : Total 6 Click Score: 7    End of Session Equipment Utilized During Treatment: Gait belt Activity Tolerance: Patient tolerated treatment well;Patient limited by pain;Patient limited by fatigue Patient left: in chair;with call bell/phone within reach;with chair alarm set;with family/visitor  present   PT Visit Diagnosis: Pain;Muscle weakness (generalized) (M62.81);Difficulty in walking, not elsewhere classified (R26.2);History of falling (Z91.81) Pain - part of body:  (R anterior thigh/groin; L hip)     Time: [id number] PT Time Calculation (min) (ACUTE ONLY): 24 min  Charges:  $Therapeutic Exercise: 8-22 mins $Therapeutic Activity: 8-22 mins                         Doreatha Massed, PT Acute Rehabilitation  Office: [phone]

## 2022-04-01 ENCOUNTER — Telehealth: Payer: Self-pay

## 2022-04-01 NOTE — Transitions of Care (Post Inpatient/ED Visit) (Signed)
   04/01/2022  Name: [name] Dodson MRN: [id number] DOB: [birth year]  Today's TOC FU Call Status: Today's TOC FU Call Status:: Successful TOC FU Call Competed TOC FU Call Complete Date: 04/01/22  Transition Care Management Follow-up Telephone Call Date of Discharge: 03/31/22 Discharge Facility: [facility]) Type of Discharge: Inpatient Admission Primary Inpatient Discharge Diagnosis:: Left hip fracture How have you been since you were released from the hospital?: Better Any questions or concerns?: No  Items Reviewed: Did you receive and understand the discharge instructions provided?: Yes Medications obtained and verified?: Yes (Medications Reviewed) Any new allergies since your discharge?: No Dietary orders reviewed?: No Do you have support at home?: Yes  Home Care and Equipment/Supplies: [place] Ordered?: Yes Name of [place]:: Agency unknown, PT Has Agency set up a time to come to your home?: Yes [place] Visit Date: 04/01/22 Any new equipment or medical supplies ordered?: Yes Name of Medical supply agency?: [employer]: Bedside commode, walker Were you able to get the equipment/medical supplies?: Yes Do you have any questions related to the use of the equipment/supplies?: No  Functional Questionnaire: Do you need assistance with bathing/showering or dressing?: No Do you need assistance with meal preparation?: No Do you need assistance with eating?: No Do you have difficulty maintaining continence: No Do you need assistance with getting out of bed/getting out of a chair/moving?: No Do you have difficulty managing or taking your medications?: No  Folllow up appointments reviewed: PCP Follow-up appointment confirmed?: No MD Provider Line Number:[phone] Given: No [facility] Follow-up appointment confirmed?: No Do you need transportation to your follow-up appointment?: No Do you understand care options if your condition(s)  worsen?: Yes-patient verbalized understanding    SIGNATURE Angeline Slim, RN, BSN

## 2022-04-07 ENCOUNTER — Telehealth: Payer: Self-pay | Admitting: Nurse Practitioner

## 2022-04-07 NOTE — Telephone Encounter (Signed)
Contacted [name] Dodson to schedule their annual wellness visit. Call back at later date: June 2024 Patient broke her hip daughter requested call back later   [place] direct phone # [phone]

## 2022-04-20 ENCOUNTER — Other Ambulatory Visit: Payer: Self-pay | Admitting: Nurse Practitioner

## 2022-04-21 NOTE — Telephone Encounter (Signed)
Last Refill 01/22/22  Next OV 05/14/22

## 2022-05-05 ENCOUNTER — Other Ambulatory Visit: Payer: Self-pay | Admitting: Nurse Practitioner

## 2022-05-14 ENCOUNTER — Ambulatory Visit: Payer: Medicare Other | Admitting: Nurse Practitioner

## 2022-07-02 ENCOUNTER — Encounter: Payer: Self-pay | Admitting: Nurse Practitioner

## 2022-07-09 ENCOUNTER — Telehealth (INDEPENDENT_AMBULATORY_CARE_PROVIDER_SITE_OTHER): Payer: Medicare Other | Admitting: Nurse Practitioner

## 2022-07-09 ENCOUNTER — Encounter: Payer: Self-pay | Admitting: Nurse Practitioner

## 2022-07-09 VITALS — Ht 68.0 in | Wt 200.0 lb

## 2022-07-09 DIAGNOSIS — R300 Dysuria: Secondary | ICD-10-CM

## 2022-07-09 DIAGNOSIS — R410 Disorientation, unspecified: Secondary | ICD-10-CM | POA: Insufficient documentation

## 2022-07-09 MED ORDER — CEPHALEXIN 500 MG PO CAPS: 500.0000 mg | ORAL_CAPSULE | Freq: Two times a day (BID) | ORAL | 0 refills | Status: DC

## 2022-07-09 NOTE — Progress Notes (Signed)
[facility] [address] [place] [address] Dept: [phone] Dept Fax: [phone]  Virtual Video Visit  I connected with Rikki Spearing on 07/09/22 at  3:00 PM EDT by a video enabled telemedicine application and verified that I am speaking with the correct person using two identifiers.  Location patient: Home Location provider: Clinic Persons participating in the virtual visit: Patient; Patient's Daughter; Rodman Pickle, NP; Malena Peer, CMA  I discussed the limitations of evaluation and management by telemedicine and the availability of in person appointments. The patient expressed understanding and agreed to proceed.  Chief Complaint  Patient presents with   Burning with Urination    For 2 weeks, feeling tired a lot, no fever-patient's daughter answered questions and not patient    SUBJECTIVE:  HPI: [name] Dodson is a 77 y.o. female who presents with burning with urination and urinary frequency for 2 weeks.  She has been experiencing dysuria and urinary frequency for 2 weeks.  Her daughter states that she wears briefs and does not like to change them frequently even when they are wet and thinks this is why she got the UTI.  She states that she has been a little bit more forgetful and confused since her fall and hip fracture with subsequent surgery.  She has not been having a good appetite recently, however does not like the Ensure or boost supplements.  She denies fevers, abdominal pain, nausea, vomiting. She denies any blood in her urine.   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   Atrial fibrillation, unspecified type  (HCC) 11/06/2021   Arthritis 11/06/2021   Gastroesophageal reflux disease 11/06/2021   Osteopenia 11/06/2021   Chronic swimmer's ear of both sides 01/10/2019    Past Surgical History:  Procedure Laterality Date   ANTERIOR APPROACH HEMI HIP ARTHROPLASTY Left 03/24/2022   Procedure: LEFT HEMI-ATHROPLASTY;  Surgeon: Jodi Geralds, MD;  Location: [facility] ORS;  Service: Orthopedics;  Laterality: Left;   cataract surgery Bilateral    CHOLECYSTECTOMY     COLONOSCOPY     ESOPHAGOGASTRODUODENOSCOPY     heart ablation     HIP SURGERY Right    with plates/screws-partial replacement   LUMBAR LAMINECTOMY/DECOMPRESSION MICRODISCECTOMY N/A 02/14/2016   Procedure: LUMBAR 3-4 DECOMPRESSION;  Surgeon: Estill Bamberg, MD;  Location: [facility] OR;  Service: Orthopedics;  Laterality: N/A;  LUMBAR 3-4 DECOMPRESSION     Family History  Problem Relation Age of Onset   Hypertension Mother    COPD Father     Social History   Tobacco Use   Smoking status: Former   Smokeless tobacco: Never   Tobacco comments:    quit smoking 14+ yrs ago  Vaping Use   Vaping Use: Never used  Substance Use Topics   Alcohol use: Yes    Comment: rarely   Drug use: No     Current Outpatient Medications:    calcium carbonate (TUMS - DOSED IN MG ELEMENTAL CALCIUM) 500 MG chewable tablet, Chew 1 tablet by mouth daily as needed for indigestion or heartburn., Disp: , Rfl:    cephALEXin (KEFLEX) 500 MG capsule, Take 1 capsule (500 mg total) by mouth 2 (two) times daily., Disp: 14 capsule, Rfl: 0  cyanocobalamin 1000 MCG tablet, Take 1 tablet (1,000 mcg total) by mouth daily., Disp: , Rfl:    flecainide (TAMBOCOR) 100 MG tablet, Take 100 mg by mouth 2 (two) times daily., Disp: , Rfl:    HYDROcodone-acetaminophen (NORCO/VICODIN) 5-325 MG tablet, Take 1-2 tablets by mouth every 6 (six) hours as needed for moderate pain., Disp: 40 tablet, Rfl: 0   ibuprofen (ADVIL) 600 MG tablet, Take 600 mg by mouth daily., Disp: , Rfl:    traMADol  (ULTRAM) 50 MG tablet, Take 50 mg by mouth every 8 (eight) hours as needed., Disp: , Rfl:    aspirin EC 325 MG tablet, Take 1 tablet (325 mg total) by mouth 2 (two) times daily after a meal. (Patient not taking: Reported on 07/09/2022), Disp: 40 tablet, Rfl: 0   diphenhydrAMINE (BENADRYL) 25 mg capsule, Take 1 capsule (25 mg total) by mouth every 8 (eight) hours as needed for itching. (Patient not taking: Reported on 07/09/2022), Disp: 30 capsule, Rfl: 0   diphenhydrAMINE-zinc acetate (BENADRYL) cream, Apply topically 3 (three) times daily as needed for itching. (Patient not taking: Reported on 07/09/2022), Disp: 28.4 g, Rfl: 0  Allergies  Allergen Reactions   Chlorhexidine Rash    Red rash and burning    ROS: See pertinent positives and negatives per HPI.  OBSERVATIONS/OBJECTIVE:  VITALS per patient if applicable: Today's Vitals   07/09/22 1505  Weight: 200 lb (90.7 kg)  Height: 5\' 8"  (1.727 m)   Body mass index is 30.41 kg/m.    GENERAL: Alert. Appears well and in no acute distress.  HEENT: Atraumatic. Conjunctiva clear. No obvious abnormalities on inspection of external nose and ears.  NECK: Normal movements of the head and neck.  LUNGS: On inspection, no signs of respiratory distress. Breathing rate appears normal. No obvious gross SOB, gasping or wheezing, and no conversational dyspnea.  CV: No obvious cyanosis.  MS: Moves all visible extremities without noticeable abnormality.  PSYCH/NEURO: Pleasant and cooperative. No obvious depression or anxiety. Speech and thought processing grossly intact.  ASSESSMENT AND PLAN:  Problem List Items Addressed This Visit       Nervous and Auditory   Confusion    She has been having some confusion that is slowly getting worse since her fall and hip fracture. CT head on 03/24/22 in ER showed atrophy. She is now staying with her daughter who is helping to care for her. Recommend that she schedule an in-person visit when able.        Other Visit Diagnoses     Dysuria    -  Primary   Will treat for presumptive UTI with keflex 500mg  BID x 7 days. Continue drinking fluids, and cranberry juice. F/U if not improving.        I discussed the assessment and treatment plan with the patient. The patient was provided an opportunity to ask questions and all were answered. The patient agreed with the plan and demonstrated an understanding of the instructions.   The patient was advised to call back or seek an in-person evaluation if the symptoms worsen or if the condition fails to improve as anticipated.   Gerre Scull, NP

## 2022-07-09 NOTE — Assessment & Plan Note (Addendum)
She has been having some confusion that is slowly getting worse since her fall and hip fracture. CT head on 03/24/22 in ER showed atrophy. She is now staying with her daughter who is helping to care for her. Recommend that she schedule an in-person visit when able.

## 2022-07-09 NOTE — Patient Instructions (Signed)
It was great to see you!  Start keflex twice a day for 7 days.   Let's follow-up in person when able.  If a referral was placed today, you will be contacted for an appointment. Please note that routine referrals can sometimes take up to 3-4 weeks to process. Please call our office if you haven't heard anything after this time frame.  Take care,  Rodman Pickle, NP

## 2022-07-09 NOTE — Telephone Encounter (Signed)
Noted, discussed at virtual visit today.

## 2022-07-17 ENCOUNTER — Telehealth: Payer: Medicare Other | Admitting: Nurse Practitioner

## 2023-04-28 ENCOUNTER — Telehealth: Payer: Self-pay

## 2023-04-28 NOTE — Telephone Encounter (Signed)
 Copied from CRM [id number]. Topic: Clinical - Home Health Verbal Orders >> Apr 28, 2023 11:29 AM Fredrich Romans wrote: Caller/Agency: [facility] Callback Number: [phone]([id number] VM)  Frances Furbish called stating that patient was admitted to an hospital [facility]. Upon discharge they would like her to have home health services.An order was sent out to [facility] would like to know if PCP is willing to follow and sign off?

## 2023-04-29 NOTE — Telephone Encounter (Signed)
 I called and left a detailed message on the secured voice mail at [facility] that it was ok.

## 2023-04-30 ENCOUNTER — Telehealth: Payer: Self-pay

## 2023-04-30 NOTE — Telephone Encounter (Signed)
 Copied from CRM [id number]. Topic: Clinical - Home Health Verbal Orders >> Apr 29, 2023  4:19 PM Drema Balzarine wrote: Caller/AgencyGabriel Rung FROM [facility]  Callback Number: [phone] Service Requested: Physical Therapy Frequency: 1wk 1, 2w 3, 1w 5 Any new concerns about the patient? No

## 2023-04-30 NOTE — Telephone Encounter (Signed)
 I called patient and left her a detailed message regarding need to do a follow up from recent hospital visit and if Lauren was still her PCP and to return call to let me know.

## 2023-04-30 NOTE — Telephone Encounter (Signed)
 I called and spoke with Gabriel Rung and gave her a verbal ok per Leotis Shames for Physical therapy for patient.

## 2023-04-30 NOTE — Telephone Encounter (Signed)
 Forwarding message below

## 2023-05-01 NOTE — Telephone Encounter (Signed)
 Left message for patient to return call.

## 2023-05-04 NOTE — Telephone Encounter (Signed)
 Left message for patient to return call.

## 2023-05-04 NOTE — Telephone Encounter (Signed)
 Noted.

## 2023-05-05 NOTE — Transitions of Care (Post Inpatient/ED Visit) (Signed)
   05/05/2023  Name: [name] Dodson MRN: [id number] DOB: 08/18/1945  Today's TOC FU Call Status: Today's TOC FU Call Status:: Unsuccessful Call (3rd Attempt) Unsuccessful Call (1st Attempt) Date: 04/30/23 Unsuccessful Call (2nd Attempt) Date: 05/01/23 Unsuccessful Call (3rd Attempt) Date: 05/04/23  Attempted to reach the patient regarding the most recent Inpatient/ED visit.  Follow Up Plan: No further outreach attempts will be made at this time. We have been unable to contact the patient.  Signature BMS

## 2023-05-06 ENCOUNTER — Telehealth: Payer: Self-pay

## 2023-05-06 NOTE — Telephone Encounter (Signed)
 Forwarding message below

## 2023-05-06 NOTE — Telephone Encounter (Signed)
 Noted.

## 2023-05-06 NOTE — Telephone Encounter (Signed)
 Copied from CRM [id number]. Topic: General - Other >> May 05, 2023  5:46 PM Denese Killings wrote: Reason for CRM: Patient daughter wanted to let [place] know that her mom has fell and has moved back to [place].

## 2023-05-06 NOTE — Telephone Encounter (Signed)
 I called and spoke with patient's daughter Lanora Manis and she said that [name] Dodson is back in [place] and living with her and that she scheduled an appointment with Leotis Shames for next Thursday because that was the earliest. She said that she was falling a lot at her sons house and has not fallen once at her daughter's house.

## 2023-05-14 ENCOUNTER — Encounter: Payer: Self-pay | Admitting: Nurse Practitioner

## 2023-05-14 ENCOUNTER — Ambulatory Visit: Admitting: Nurse Practitioner

## 2023-05-14 VITALS — BP 122/78 | HR 66 | Temp 97.1°F | Ht 68.0 in | Wt 189.0 lb

## 2023-05-14 DIAGNOSIS — E041 Nontoxic single thyroid nodule: Secondary | ICD-10-CM | POA: Insufficient documentation

## 2023-05-14 DIAGNOSIS — I4891 Unspecified atrial fibrillation: Secondary | ICD-10-CM

## 2023-05-14 DIAGNOSIS — F0390 Unspecified dementia without behavioral disturbance: Secondary | ICD-10-CM

## 2023-05-14 DIAGNOSIS — S0101XA Laceration without foreign body of scalp, initial encounter: Secondary | ICD-10-CM | POA: Insufficient documentation

## 2023-05-14 DIAGNOSIS — R55 Syncope and collapse: Secondary | ICD-10-CM

## 2023-05-14 DIAGNOSIS — W19XXXA Unspecified fall, initial encounter: Secondary | ICD-10-CM | POA: Insufficient documentation

## 2023-05-14 DIAGNOSIS — W19XXXD Unspecified fall, subsequent encounter: Secondary | ICD-10-CM | POA: Diagnosis not present

## 2023-05-14 DIAGNOSIS — S0101XD Laceration without foreign body of scalp, subsequent encounter: Secondary | ICD-10-CM

## 2023-05-14 DIAGNOSIS — R770 Abnormality of albumin: Secondary | ICD-10-CM | POA: Insufficient documentation

## 2023-05-14 DIAGNOSIS — R911 Solitary pulmonary nodule: Secondary | ICD-10-CM

## 2023-05-14 DIAGNOSIS — R0781 Pleurodynia: Secondary | ICD-10-CM

## 2023-05-14 MED ORDER — CEPHALEXIN 500 MG PO CAPS: 500.0000 mg | ORAL_CAPSULE | Freq: Two times a day (BID) | ORAL | 0 refills | Status: DC

## 2023-05-14 NOTE — Assessment & Plan Note (Signed)
 She sustained head trauma with a laceration requiring staples and there is concern for infection at the injury site. Prescribe Keflex 500mg  twice a day for seven days.

## 2023-05-14 NOTE — Assessment & Plan Note (Signed)
 She has sustained 2 recent falls, most likely from syncope due to dehydration. Encourage fluids. She is still having some weakness and would benefit from PT. Home health PT ordered today.

## 2023-05-14 NOTE — Assessment & Plan Note (Signed)
 Noted incidentally on CT scan. Declines further follow-up.

## 2023-05-14 NOTE — Assessment & Plan Note (Signed)
 Chronic, stable. She is currently back to living with her daughter after living with her grandson for 5 months.

## 2023-05-14 NOTE — Assessment & Plan Note (Signed)
 Low albumin levels indicate potential protein deficiency, and weight loss is noted. Emphasize protein intake through diet, including chicken and protein shakes or Ensure if needed. Monitor nutritional status and weight.

## 2023-05-14 NOTE — Assessment & Plan Note (Signed)
 She experiences generalized pain in the chest and legs likely from falls and bruising. Tramadol was discontinued due to cognitive side effects. Continue ibuprofen for pain management and use Tylenol as needed for additional pain relief. Declines further x-ray since second fall.

## 2023-05-14 NOTE — Patient Instructions (Signed)
 It was great to see you!  Make sure you are eating and drinking plenty of fluids  I have ordered PT to come to your house.   Start keflex twice a day for head  Let's follow-up in 3 months, sooner if you have concerns.  If a referral was placed today, you will be contacted for an appointment. Please note that routine referrals can sometimes take up to 3-4 weeks to process. Please call our office if you haven't heard anything after this time frame.  Take care,  Rodman Pickle, NP

## 2023-05-14 NOTE — Progress Notes (Signed)
 Established Patient Office Visit  Subjective   Patient ID: [name] Dodson, female    DOB: 19-Sep-1945  Age: 78 y.o. MRN: [id number]  Chief Complaint  Patient presents with   Hospitalization Follow-up    Larey Seat on 04/24/23 and went to hospital and fell again a week later and went back to hospital, complains of rib pain from fall    HPI  Discussed the use of AI scribe software for clinical note transcription with the patient, who gave verbal consent to proceed.  History of Present Illness   The patient, a known case of atrial fibrillation, presents with generalized body pain after multiple falls. The patient has recently experienced two falls, the first of which resulted in a head injury that required staples. The second fall led to multiple bruises and a significant amount of pain. The patient's daughter reports that the patient was not eating or drinking adequately during the time of the falls, which may have contributed to the patient's symptoms. The patient's pain is most severe in the ribs and legs, and she reports difficulty taking deep breaths due to chest pain. The patient also reports dizziness, but it is unclear if this is a new symptom or related to her known atrial fibrillation. The patient is currently on flecainide for atrial fibrillation and ibuprofen for pain management.       ROS See pertinent positives and negatives per HPI.    Objective:     BP 122/78 (BP Location: Left Arm, Patient Position: Sitting, Cuff Size: Normal)   Pulse 66   Temp (!) 97.1 F (36.2 C)   Ht 5\' 8"  (1.727 m)   Wt 189 lb (85.7 kg)   SpO2 97%   BMI 28.74 kg/m  BP Readings from Last 3 Encounters:  05/14/23 122/78  03/31/22 128/74  02/12/22 126/74   Wt Readings from Last 3 Encounters:  05/14/23 189 lb (85.7 kg)  07/09/22 200 lb (90.7 kg)  03/24/22 189 lb 9.5 oz (86 kg)      Physical Exam Vitals and nursing note reviewed.  Constitutional:      General: She is not in acute distress.     Appearance: Normal appearance.  HENT:     Head: Normocephalic.  Eyes:     Conjunctiva/sclera: Conjunctivae normal.  Cardiovascular:     Rate and Rhythm: Normal rate and regular rhythm.     Pulses: Normal pulses.     Heart sounds: Normal heart sounds.  Pulmonary:     Effort: Pulmonary effort is normal.     Breath sounds: Normal breath sounds.  Musculoskeletal:        General: Tenderness (right ribs) present. No swelling.     Cervical back: Normal range of motion.  Skin:    General: Skin is warm.     Findings: Bruising (behind left knee, buttock, left hip) present.     Comments: Erythema surrounding scabbed abrasion on scalp from fall   Neurological:     Mental Status: She is alert. Mental status is at baseline.  Psychiatric:        Mood and Affect: Mood normal.        Behavior: Behavior normal.        Thought Content: Thought content normal.        Judgment: Judgment normal.      Assessment & Plan:   Problem List Items Addressed This Visit       Cardiovascular and Mediastinum   Atrial fibrillation, unspecified type (HCC) - Primary  The condition is well-managed with the current medication. Continue the current medication regimen with flecainide (Tambocor) 100mg  BID.         Respiratory   Lung nodule   Noted incidentally on CT scan. Declines further follow-up.         Endocrine   Thyroid nodule   Noted incidentally on CT scan. Declines further follow-up.         Nervous and Auditory   Dementia without behavioral disturbance, psychotic disturbance, mood disturbance, or anxiety (HCC)   Chronic, stable. She is currently back to living with her daughter after living with her grandson for 5 months.         Other   Fall   She has sustained 2 recent falls, most likely from syncope due to dehydration. Encourage fluids. She is still having some weakness and would benefit from PT. Home health PT ordered today.       Relevant Orders   Ambulatory referral to Home  Health   Rib pain   She experiences generalized pain in the chest and legs likely from falls and bruising. Tramadol was discontinued due to cognitive side effects. Continue ibuprofen for pain management and use Tylenol as needed for additional pain relief. Declines further x-ray since second fall.       Syncope   Episodes were likely due to dehydration and hypokalemia, now resolved, with no further episodes since returning to her current living situation. Ensure adequate hydration and nutrition to prevent dehydration-related syncope. Recent CMP and CBC reviewed.       Scalp laceration   She sustained head trauma with a laceration requiring staples and there is concern for infection at the injury site. Prescribe Keflex 500mg  twice a day for seven days.      Low serum albumin   Low albumin levels indicate potential protein deficiency, and weight loss is noted. Emphasize protein intake through diet, including chicken and protein shakes or Ensure if needed. Monitor nutritional status and weight.       Return in about 3 months (around 08/13/2023) for follow-up.    Gerre Scull, NP

## 2023-05-14 NOTE — Assessment & Plan Note (Signed)
 Episodes were likely due to dehydration and hypokalemia, now resolved, with no further episodes since returning to her current living situation. Ensure adequate hydration and nutrition to prevent dehydration-related syncope. Recent CMP and CBC reviewed.

## 2023-05-14 NOTE — Assessment & Plan Note (Signed)
 The condition is well-managed with the current medication. Continue the current medication regimen with flecainide (Tambocor) 100mg  BID.

## 2023-05-21 ENCOUNTER — Telehealth: Payer: Self-pay

## 2023-05-21 NOTE — Telephone Encounter (Signed)
 Daughter states pt has not been D/C'd from anywhere.

## 2023-07-09 ENCOUNTER — Encounter: Payer: Self-pay | Admitting: Nurse Practitioner

## 2023-07-09 MED ORDER — IBUPROFEN 600 MG PO TABS: 600.0000 mg | ORAL_TABLET | Freq: Every day | ORAL | 0 refills | Status: DC

## 2023-07-09 NOTE — Telephone Encounter (Signed)
 Requesting: Ibuprofen 600mg  Last Visit: 05/14/2023 Next Visit: 08/13/2023 Last Refill: 06/15/2022 by Historical Provider  Please Advise

## 2023-08-13 ENCOUNTER — Ambulatory Visit: Admitting: Nurse Practitioner

## 2023-08-13 VITALS — BP 120/76 | HR 66 | Temp 96.9°F | Ht 68.0 in | Wt 190.6 lb

## 2023-08-13 DIAGNOSIS — F0390 Unspecified dementia without behavioral disturbance: Secondary | ICD-10-CM

## 2023-08-13 DIAGNOSIS — I4891 Unspecified atrial fibrillation: Secondary | ICD-10-CM

## 2023-08-13 DIAGNOSIS — W19XXXD Unspecified fall, subsequent encounter: Secondary | ICD-10-CM

## 2023-08-13 MED ORDER — FLECAINIDE ACETATE 100 MG PO TABS: 100.0000 mg | ORAL_TABLET | Freq: Two times a day (BID) | ORAL | 1 refills | Status: DC

## 2023-08-13 NOTE — Assessment & Plan Note (Signed)
 Denies further falls and headache is improving.

## 2023-08-13 NOTE — Assessment & Plan Note (Addendum)
 Chronic, stable. She is on flecainide  100 mg once daily and has declined further procedures and cardiology referral unless issues arise. Continue flecainide  100 mg once daily.

## 2023-08-13 NOTE — Patient Instructions (Signed)
 It was great to see you!  I have refilled your flecainide   Let's follow-up in 3 months, sooner if you have concerns.  If a referral was placed today, you will be contacted for an appointment. Please note that routine referrals can sometimes take up to 3-4 weeks to process. Please call our office if you haven't heard anything after this time frame.  Take care,  Tinnie Harada, NP

## 2023-08-13 NOTE — Progress Notes (Signed)
 Established Patient Office Visit  Subjective   Patient ID: [name] Dodson, female    DOB: 1945-02-08  Age: 78 y.o. MRN: [id number]  Chief Complaint  Patient presents with   Atrial Fibrillation    Follow up, no concerns    HPI  Discussed the use of AI scribe software for clinical note transcription with the patient, who gave verbal consent to proceed.  History of Present Illness   [name] Dodson is an 78 year old female who presents for a follow-up visit.  In March, she fell while with her grandson, resulting in bruising and persistent head pain at the back of her head. The bruises have healed, and the head pain is improving but still present. No additional falls have occurred since then.  She takes flecainide  100 mg once daily, although the prescription indicates twice daily dosing. Her daughter administers the medication around noon because the patient will not take medications more than once a day. She uses Tylenol  on rainy days for bone pain. No chest pain, dyspnea, or leg swelling.  Her daughter notes fluctuating memory issues with her dementia, with episodes of believing she is in [place]  with her parents. She denies feeling sad or crying spells. She declines any more medications to help with memory.        ROS See pertinent positives and negatives per HPI.    Objective:     BP 120/76 (BP Location: Left Arm, Patient Position: Sitting, Cuff Size: Normal)   Pulse 66   Temp (!) 96.9 F (36.1 C)   Ht 5' 8 (1.727 m)   Wt 190 lb 9.6 oz (86.5 kg)   SpO2 96%   BMI 28.98 kg/m  BP Readings from Last 3 Encounters:  08/13/23 120/76  05/14/23 122/78  03/31/22 128/74   Wt Readings from Last 3 Encounters:  08/13/23 190 lb 9.6 oz (86.5 kg)  05/14/23 189 lb (85.7 kg)  07/09/22 200 lb (90.7 kg)      Physical Exam Vitals and nursing note reviewed.  Constitutional:      General: She is not in acute distress.    Appearance: Normal appearance.  HENT:     Head:  Normocephalic.  Eyes:     Conjunctiva/sclera: Conjunctivae normal.  Cardiovascular:     Rate and Rhythm: Normal rate and regular rhythm.     Pulses: Normal pulses.     Heart sounds: Normal heart sounds.  Pulmonary:     Effort: Pulmonary effort is normal.     Breath sounds: Normal breath sounds.  Musculoskeletal:     Cervical back: Normal range of motion.  Skin:    General: Skin is warm.  Neurological:     Mental Status: She is alert. Mental status is at baseline.  Psychiatric:        Mood and Affect: Mood normal.        Behavior: Behavior normal.        Thought Content: Thought content normal.        Judgment: Judgment normal.      Assessment & Plan:   Problem List Items Addressed This Visit       Cardiovascular and Mediastinum   Atrial fibrillation, unspecified type (HCC) - Primary   Chronic, stable. She is on flecainide  100 mg once daily and has declined further procedures and cardiology referral unless issues arise. Continue flecainide  100 mg once daily.      Relevant Medications   flecainide  (TAMBOCOR ) 100 MG tablet  Nervous and Auditory   Dementia without behavioral disturbance, psychotic disturbance, mood disturbance, or anxiety (HCC)   Chronic, stable. She experiences fluctuating memory issues. Memantine and donepezil were discussed, but she prefers to defer medication.         Other   Fall   Denies further falls and headache is improving.        Return in about 3 months (around 11/13/2023) for routine follow-up.    Tinnie DELENA Harada, NP

## 2023-08-13 NOTE — Addendum Note (Signed)
 Addended by: Shemicka Cohrs A on: 08/13/2023 01:43 PM   Modules accepted: Level of Service

## 2023-08-13 NOTE — Assessment & Plan Note (Addendum)
 Chronic, stable. She experiences fluctuating memory issues. Memantine and donepezil were discussed, but she prefers to defer medication.

## 2023-08-20 ENCOUNTER — Encounter: Payer: Self-pay | Admitting: Nurse Practitioner

## 2023-08-21 MED ORDER — FLECAINIDE ACETATE 100 MG PO TABS: 100.0000 mg | ORAL_TABLET | Freq: Two times a day (BID) | ORAL | 1 refills | Status: AC

## 2023-08-21 NOTE — Telephone Encounter (Signed)
 I called [facility] and spoke with Thedora and he did not see Rx of Flecainide  that was sent in by Lauren on 08/13/23. I resent Rx to pharmacy.

## 2023-08-21 NOTE — Addendum Note (Signed)
 Addended by: GLADIS CLAUDENE GRATE Y on: 08/21/2023 04:23 PM   Modules accepted: Orders

## 2023-09-18 ENCOUNTER — Ambulatory Visit (INDEPENDENT_AMBULATORY_CARE_PROVIDER_SITE_OTHER)

## 2023-09-18 ENCOUNTER — Ambulatory Visit

## 2023-09-18 DIAGNOSIS — Z0000 Encounter for general adult medical examination without abnormal findings: Secondary | ICD-10-CM | POA: Diagnosis not present

## 2023-09-18 NOTE — Patient Instructions (Signed)
 Ms. Finnicum , Thank you for taking time out of your busy schedule to complete your Annual Wellness Visit with me. I enjoyed our conversation and look forward to speaking with you again next year. I, as well as your care team,  appreciate your ongoing commitment to your health goals. Please review the following plan we discussed and let me know if I can assist you in the future. Your Game plan/ To Do List    Referrals: If you haven't heard from the office you've been referred to, please reach out to them at the phone provided.   Follow up Visits: We will see or speak with you next year for your Next Medicare AWV with our clinical staff Have you seen your provider in the last 6 months (3 months if uncontrolled diabetes)? Yes  Clinician Recommendations:  Aim for 30 minutes of exercise or brisk walking, 6-8 glasses of water , and 5 servings of fruits and vegetables each day.       This is a list of the screenings recommended for you:  Health Maintenance  Topic Date Due   COVID-19 Vaccine (1 - 2024-25 season) Never done   Flu Shot  09/04/2023   Zoster (Shingles) Vaccine (1 of 2) 11/13/2023*   Medicare Annual Wellness Visit  09/17/2024   DTaP/Tdap/Td vaccine (3 - Td or Tdap) 12/27/2028   Pneumococcal Vaccine for age over 66  Completed   DEXA scan (bone density measurement)  Completed   HPV Vaccine  Aged Out   Meningitis B Vaccine  Aged Out  *Topic was postponed. The date shown is not the original due date.    Advanced directives: (Copy Requested) Please bring a copy of your health care power of attorney and living will to the office to be added to your chart at your convenience. You can mail to [facility] [address] [place], [place] [address] or email to [email or web address] Advance Care Planning is important because it:  [x]  Makes sure you receive the medical care that is consistent with your values, goals, and preferences  [x]  It provides guidance to your family  and loved ones and reduces their decisional burden about whether or not they are making the right decisions based on your wishes.  Follow the link provided in your after visit summary or read over the paperwork we have mailed to you to help you started getting your Advance Directives in place. If you need assistance in completing these, please reach out to us  so that we can help you!  See attachments for Preventive Care and Fall Prevention Tips.

## 2023-09-18 NOTE — Progress Notes (Signed)
 Subjective:   [name] Dodson is a 78 y.o. who presents for a Medicare Wellness preventive visit.  As a reminder, Annual Wellness Visits don't include a physical exam, and some assessments may be limited, especially if this visit is performed virtually. We may recommend an in-person follow-up visit with your provider if needed.  Visit Complete: Virtual I connected with  [name] Dodson on 09/18/23 by a audio enabled telemedicine application and verified that I am speaking with the correct person using two identifiers.  Patient Location: Home  Provider Location: Office/Clinic  I discussed the limitations of evaluation and management by telemedicine. The patient expressed understanding and agreed to proceed.  Vital Signs: Because this visit was a virtual/telehealth visit, some criteria may be missing or patient reported. Any vitals not documented were not able to be obtained and vitals that have been documented are patient reported.  VideoError- Librarian, academic were attempted between this provider and patient, however failed, due to patient having technical difficulties OR patient did not have access to video capability.  We continued and completed visit with audio only.   Persons Participating in Visit: Patient assisted by daughter.  AWV Questionnaire: No: Patient Medicare AWV questionnaire was not completed prior to this visit.  Cardiac Risk Factors include: advanced age (>15men, >39 women)     Objective:    Today's Vitals   There is no height or weight on file to calculate BMI.     09/18/2023    4:23 PM 03/24/2022   12:06 PM 03/24/2022    9:49 AM 03/24/2022    5:45 AM 06/12/2016    2:14 PM 06/11/2016    6:58 AM 06/06/2016   10:04 AM  Advanced Directives  Does Patient Have a Medical Advance Directive? Yes Yes Yes No Yes  Yes  Yes   Type of Estate agent of [place];Living will Healthcare Power of [place];Living will Healthcare Power  of [place];Living will   Healthcare Power of [place];Living will Healthcare Power of [place];Living will  Does patient want to make changes to medical advance directive?  No - Patient declined No - Patient declined  No - Patient declined   No - Patient declined   Copy of Healthcare Power of Attorney in Chart? No - copy requested No - copy requested No - copy requested   No - copy requested  No - copy requested   Would patient like information on creating a medical advance directive?  No - Patient declined No - Patient declined    No - Patient declined      Data saved with a previous flowsheet row definition    Current Medications (verified) Outpatient Encounter Medications as of 09/18/2023  Medication Sig   Acetaminophen  (TYLENOL  PO) Take by mouth as needed.   calcium  carbonate (TUMS - DOSED IN MG ELEMENTAL CALCIUM ) 500 MG chewable tablet Chew 1 tablet by mouth daily as needed for indigestion or heartburn.   flecainide  (TAMBOCOR ) 100 MG tablet Take 1 tablet (100 mg total) by mouth 2 (two) times daily.   ibuprofen  (ADVIL ) 600 MG tablet Take 1 tablet (600 mg total) by mouth daily.   No facility-administered encounter medications on file as of 09/18/2023.    Allergies (verified) Chlorhexidine    History: Past Medical History:  Diagnosis Date   Arthritis    Atrial fibrillation (HCC)    afib 04/08/08 s/p DCCV 05/05/09, s/p CTI ablation 10/02/09; recurrent afib 2016 s/p DCCV 10/27/14. On flecainide , ASA. (Dr. Mae Bare)  Chronic back pain    stenosis   Dry skin    Dyspnea    Dysrhythmia    a fib   GERD (gastroesophageal reflux disease)    Tums daily   History of blood transfusion    no abnormal reaction   History of colon polyps    benign   Joint pain    Nocturia    Pneumonia    hx of   PONV (postoperative nausea and vomiting)    Weakness    numbness and tingling in right leg/foot   Past Surgical History:  Procedure Laterality Date   ANTERIOR APPROACH HEMI HIP ARTHROPLASTY Left  03/24/2022   Procedure: LEFT HEMI-ATHROPLASTY;  Surgeon: Yvone Rush, MD;  Location: [facility];  Service: Orthopedics;  Laterality: Left;   cataract surgery Bilateral    CHOLECYSTECTOMY     COLONOSCOPY     ESOPHAGOGASTRODUODENOSCOPY     heart ablation     HIP SURGERY Right    with plates/screws-partial replacement   LUMBAR LAMINECTOMY/DECOMPRESSION MICRODISCECTOMY N/A 02/14/2016   Procedure: LUMBAR 3-4 DECOMPRESSION;  Surgeon: Oneil Priestly, MD;  Location: [facility];  Service: Orthopedics;  Laterality: N/A;  LUMBAR 3-4 DECOMPRESSION    Family History  Problem Relation Age of Onset   Hypertension Mother    COPD Father    Social History   Socioeconomic History   Marital status: Widowed    Spouse name: Not on file   Number of children: Not on file   Years of education: Not on file   Highest education level: Associate degree: occupational, Scientist, product/process development, or vocational program  Occupational History   Not on file  Tobacco Use   Smoking status: Former   Smokeless tobacco: Never   Tobacco comments:    quit smoking 14+ yrs ago  Vaping Use   Vaping status: Never Used  Substance and Sexual Activity   Alcohol use: Yes    Comment: rarely   Drug use: No   Sexual activity: Not Currently    Birth control/protection: Post-menopausal  Other Topics Concern   Not on file  Social History Narrative   Not on file   Social Drivers of Health   Financial Resource Strain: Low Risk  (09/18/2023)   Overall Financial Resource Strain (CARDIA)    Difficulty of Paying Living Expenses: Not hard at all  Food Insecurity: No Food Insecurity (09/18/2023)   Hunger Vital Sign    Worried About Running Out of Food in the Last Year: Never true    Ran Out of Food in the Last Year: Never true  Transportation Needs: No Transportation Needs (09/18/2023)   PRAPARE - Administrator, Civil Service (Medical): No    Lack of Transportation (Non-Medical): No  Physical Activity: Inactive (09/18/2023)   Exercise Vital  Sign    Days of Exercise per Week: 0 days    Minutes of Exercise per Session: 0 min  Stress: No Stress Concern Present (09/18/2023)   [employer] of Occupational Health - Occupational Stress Questionnaire    Feeling of Stress: Not at all  Social Connections: Moderately Isolated (09/18/2023)   Social Connection and Isolation Panel    Frequency of Communication with Friends and Family: Three times a week    Frequency of Social Gatherings with Friends and Family: Not on file    Attends Religious Services: 1 to 4 times per year    Active Member of [employer] or Organizations: No    Attends Banker Meetings: Never  Marital Status: Widowed    Tobacco Counseling Counseling given: Not Answered Tobacco comments: quit smoking 14+ yrs ago    Clinical Intake:  Pre-visit preparation completed: Yes  Pain : No/denies pain     Nutritional Risks: None Diabetes: No  Lab Results  Component Value Date   HGBA1C 4.8 03/26/2022     How often do you need to have someone help you when you read instructions, pamphlets, or other written materials from your doctor or pharmacy?: 1 - Never  Interpreter Needed?: No  Information entered by :: NAllen LPN   Activities of Daily Living     09/18/2023    4:18 PM  In your present state of health, do you have any difficulty performing the following activities:  Hearing? 0  Vision? 0  Difficulty concentrating or making decisions? 1  Walking or climbing stairs? 0  Dressing or bathing? 0  Doing errands, shopping? 1  Preparing Food and eating ? N  Using the Toilet? N  In the past six months, have you accidently leaked urine? Y  Do you have problems with loss of bowel control? N  Managing your Medications? Y  Managing your Finances? Y  Housekeeping or managing your Housekeeping? Y    Patient Care Team: Nedra Tinnie LABOR, NP as PCP - General (Internal Medicine)  I have updated your Care Teams any recent Medical Services you may  have received from other providers in the past year.     Assessment:   This is a routine wellness examination for Jaymi.  Hearing/Vision screen Hearing Screening - Comments:: Denies hearing issues Vision Screening - Comments:: Regular eye exams,    Goals Addressed             This Visit's Progress    Patient Stated       09/18/2023, denies goals       Depression Screen     09/18/2023    4:24 PM 05/14/2023   11:23 AM 04/01/2022    1:45 PM 11/06/2021   10:06 AM  PHQ 2/9 Scores  PHQ - 2 Score 0 6 0 1  PHQ- 9 Score  10  5    Fall Risk     09/18/2023    4:23 PM 05/14/2023   11:22 AM 02/12/2022   11:06 AM 11/06/2021    9:19 AM  Fall Risk   Falls in the past year? 1 1 0 0  Number falls in past yr: 0 1  0  Injury with Fall? 0 1  0  Risk for fall due to : Medication side effect History of fall(s) No Fall Risks No Fall Risks  Follow up Falls evaluation completed;Falls prevention discussed Falls evaluation completed Falls evaluation completed       Data saved with a previous flowsheet row definition    MEDICARE RISK AT HOME:  Medicare Risk at Home Any stairs in or around the home?: No If so, are there any without handrails?: No Home free of loose throw rugs in walkways, pet beds, electrical cords, etc?: Yes Adequate lighting in your home to reduce risk of falls?: Yes Life alert?: No Use of a cane, walker or w/c?: No Grab bars in the bathroom?: Yes Shower chair or bench in shower?: Yes Elevated toilet seat or a handicapped toilet?: Yes  TIMED UP AND GO:  Was the test performed?  No  Cognitive Function: Impaired: Patient has current diagnosis of cognitive impairment.        Immunizations Immunization History  Administered  Date(s) Administered   Pneumococcal Conjugate-13 08/04/2013   Pneumococcal Polysaccharide-23 07/10/2015   Tdap 08/04/2013, 12/28/2018   Zoster, Live 07/10/2015    Screening Tests Health Maintenance  Topic Date Due   COVID-19 Vaccine (1 -  2024-25 season) Never done   INFLUENZA VACCINE  09/04/2023   Zoster Vaccines- Shingrix (1 of 2) 11/13/2023 (Originally 02/03/1993)   Medicare Annual Wellness (AWV)  09/17/2024   DTaP/Tdap/Td (3 - Td or Tdap) 12/27/2028   Pneumococcal Vaccine: 50+ Years  Completed   DEXA SCAN  Completed   HPV VACCINES  Aged Out   Meningococcal B Vaccine  Aged Out    Health Maintenance  Health Maintenance Due  Topic Date Due   COVID-19 Vaccine (1 - 2024-25 season) Never done   INFLUENZA VACCINE  09/04/2023   Health Maintenance Items Addressed: Declines vaccines  Additional Screening:  Vision Screening: Recommended annual ophthalmology exams for early detection of glaucoma and other disorders of the eye. Would you like a referral to an eye doctor? No    Dental Screening: Recommended annual dental exams for proper oral hygiene  Community Resource Referral / Chronic Care Management: CRR required this visit?  No   CCM required this visit?  No   Plan:    I have personally reviewed and noted the following in the patient's chart:   Medical and social history Use of alcohol, tobacco or illicit drugs  Current medications and supplements including opioid prescriptions. Patient is not currently taking opioid prescriptions. Functional ability and status Nutritional status Physical activity Advanced directives List of other physicians Hospitalizations, surgeries, and ER visits in previous 12 months Vitals Screenings to include cognitive, depression, and falls Referrals and appointments  In addition, I have reviewed and discussed with patient certain preventive protocols, quality metrics, and best practice recommendations. A written personalized care plan for preventive services as well as general preventive health recommendations were provided to patient.   Ardella FORBES Dawn, LPN   1/84/7974   After Visit Summary: (MyChart) Due to this being a telephonic visit, the after visit summary with  patients personalized plan was offered to patient via MyChart   Notes: Nothing significant to report at this time.

## 2023-10-11 ENCOUNTER — Other Ambulatory Visit: Payer: Self-pay | Admitting: Nurse Practitioner

## 2023-10-12 ENCOUNTER — Encounter: Payer: Self-pay | Admitting: Nurse Practitioner

## 2023-10-12 MED ORDER — IBUPROFEN 600 MG PO TABS: 600.0000 mg | ORAL_TABLET | Freq: Every day | ORAL | 0 refills | Status: DC

## 2023-10-12 NOTE — Telephone Encounter (Signed)
 Requesting: Ibuprofen  600mg .  Last Visit: 08/13/2023 Next Visit: 11/13/2023 Last Refill: 07/09/2023  Please Advise

## 2023-10-20 ENCOUNTER — Encounter: Payer: Self-pay | Admitting: Nurse Practitioner

## 2023-11-13 ENCOUNTER — Ambulatory Visit: Admitting: Nurse Practitioner

## 2023-12-16 ENCOUNTER — Ambulatory Visit: Payer: Self-pay | Admitting: Nurse Practitioner

## 2023-12-16 ENCOUNTER — Ambulatory Visit: Admitting: Nurse Practitioner

## 2023-12-16 ENCOUNTER — Encounter: Payer: Self-pay | Admitting: Nurse Practitioner

## 2023-12-16 VITALS — BP 124/80 | HR 63 | Temp 98.3°F | Ht 68.0 in | Wt 186.0 lb

## 2023-12-16 DIAGNOSIS — L853 Xerosis cutis: Secondary | ICD-10-CM

## 2023-12-16 DIAGNOSIS — M1990 Unspecified osteoarthritis, unspecified site: Secondary | ICD-10-CM

## 2023-12-16 DIAGNOSIS — Z111 Encounter for screening for respiratory tuberculosis: Secondary | ICD-10-CM | POA: Diagnosis not present

## 2023-12-16 DIAGNOSIS — M8580 Other specified disorders of bone density and structure, unspecified site: Secondary | ICD-10-CM | POA: Diagnosis not present

## 2023-12-16 DIAGNOSIS — I4891 Unspecified atrial fibrillation: Secondary | ICD-10-CM

## 2023-12-16 DIAGNOSIS — E559 Vitamin D deficiency, unspecified: Secondary | ICD-10-CM

## 2023-12-16 DIAGNOSIS — F0390 Unspecified dementia without behavioral disturbance: Secondary | ICD-10-CM | POA: Diagnosis not present

## 2023-12-16 DIAGNOSIS — Z136 Encounter for screening for cardiovascular disorders: Secondary | ICD-10-CM | POA: Diagnosis not present

## 2023-12-16 LAB — CBC WITH DIFFERENTIAL/PLATELET
Basophils Absolute: 0.1 K/uL (ref 0.0–0.1)
Basophils Relative: 1 % (ref 0.0–3.0)
Eosinophils Absolute: 0.2 K/uL (ref 0.0–0.7)
Eosinophils Relative: 2.2 % (ref 0.0–5.0)
HCT: 41 % (ref 36.0–46.0)
Hemoglobin: 14.1 g/dL (ref 12.0–15.0)
Lymphocytes Relative: 24.8 % (ref 12.0–46.0)
Lymphs Abs: 1.8 K/uL (ref 0.7–4.0)
MCHC: 34.3 g/dL (ref 30.0–36.0)
MCV: 105.6 fl — ABNORMAL HIGH (ref 78.0–100.0)
Monocytes Absolute: 0.5 K/uL (ref 0.1–1.0)
Monocytes Relative: 7.4 % (ref 3.0–12.0)
Neutro Abs: 4.7 K/uL (ref 1.4–7.7)
Neutrophils Relative %: 64.6 % (ref 43.0–77.0)
Platelets: 236 K/uL (ref 150.0–400.0)
RBC: 3.89 Mil/uL (ref 3.87–5.11)
RDW: 13.7 % (ref 11.5–15.5)
WBC: 7.2 K/uL (ref 4.0–10.5)

## 2023-12-16 LAB — TSH: TSH: 0.74 u[IU]/mL (ref 0.35–5.50)

## 2023-12-16 LAB — COMPREHENSIVE METABOLIC PANEL WITH GFR
ALT: 12 U/L (ref 0–35)
AST: 16 U/L (ref 0–37)
Albumin: 3.9 g/dL (ref 3.5–5.2)
Alkaline Phosphatase: 60 U/L (ref 39–117)
BUN: 15 mg/dL (ref 6–23)
CO2: 27 meq/L (ref 19–32)
Calcium: 9.4 mg/dL (ref 8.4–10.5)
Chloride: 104 meq/L (ref 96–112)
Creatinine, Ser: 0.57 mg/dL (ref 0.40–1.20)
GFR: 85.56 mL/min (ref >60.00)
Glucose, Bld: 118 mg/dL — ABNORMAL HIGH (ref 70–99)
Potassium: 3.6 meq/L (ref 3.5–5.1)
Sodium: 139 meq/L (ref 135–145)
Total Bilirubin: 0.7 mg/dL (ref 0.2–1.2)
Total Protein: 6.8 g/dL (ref 6.0–8.3)

## 2023-12-16 LAB — LIPID PANEL
Cholesterol: 114 mg/dL (ref 0–200)
HDL: 50.4 mg/dL (ref >39.00)
LDL Cholesterol: 54 mg/dL (ref 0–99)
NonHDL: 63.59
Total CHOL/HDL Ratio: 2
Triglycerides: 50 mg/dL (ref 0.0–149.0)
VLDL: 10 mg/dL (ref 0.0–40.0)

## 2023-12-16 LAB — VITAMIN D 25 HYDROXY (VIT D DEFICIENCY, FRACTURES): VITD: 15.43 ng/mL — ABNORMAL LOW (ref 30.00–100.00)

## 2023-12-16 MED ORDER — ACETAMINOPHEN 500 MG PO TABS: 1000.0000 mg | ORAL_TABLET | Freq: Three times a day (TID) | ORAL | Status: AC | PRN

## 2023-12-16 MED ORDER — DEXTROMETHORPHAN HBR 15 MG/5ML PO SYRP: 10.0000 mL | ORAL_SOLUTION | Freq: Four times a day (QID) | ORAL | Status: AC | PRN

## 2023-12-16 MED ORDER — CALCIUM CARBONATE ANTACID 500 MG PO CHEW: 1.0000 | CHEWABLE_TABLET | Freq: Four times a day (QID) | ORAL | Status: AC | PRN

## 2023-12-16 MED ORDER — LOPERAMIDE HCL 2 MG PO TABS: 2.0000 mg | ORAL_TABLET | Freq: Four times a day (QID) | ORAL | Status: AC | PRN

## 2023-12-16 MED ORDER — POLYETHYLENE GLYCOL 3350 17 GM/SCOOP PO POWD: 17.0000 g | Freq: Every day | ORAL | Status: AC | PRN

## 2023-12-16 NOTE — Assessment & Plan Note (Signed)
 Chronic, stable. She continues to have some ongoing memory impairment. Her daughter and son have decided to move her to an Assisted Living Facility. Will complete forms and requirements for this. She declines medication at this point.

## 2023-12-16 NOTE — Progress Notes (Signed)
 Established Patient Office Visit  Subjective   Patient ID: [name] Dodson, female    DOB: July 28, 1945  Age: 78 y.o. MRN: [id number]  Chief Complaint  Patient presents with   Form Completion    With TB Skin test or lab test    HPI  Discussed the use of AI scribe software for clinical note transcription with the patient, who gave verbal consent to proceed.  History of Present Illness   [name] Dodson is an 78 year old female who presents for paperwork completion and evaluation of leg pain.  She experiences right leg pain, particularly during ambulation, which worsens with pressure while walking. Swelling is present in her feet and ankles. She uses a cane for support and a walker at home, with no recent falls. She is able to dress herself with some difficulty and can shower and use the bathroom independently, wearing briefs for occasional incontinence. She reports dry skin and bruising but no ulcers or bed sores. She has increased her water  intake but continues to consume coffee, tea, and Coke regularly. Her ears have been dry and painful recently. No recent falls, unexplained weight loss, prolonged coughing, hemoptysis, or night sweats. Occasional leg weakness is noted.       ROS See pertinent positives and negatives per HPI.    Objective:     BP 124/80 (BP Location: Left Arm, Patient Position: Sitting, Cuff Size: Normal)   Pulse 63   Temp 98.3 F (36.8 C) (Oral)   Ht 5' 8 (1.727 m)   Wt 186 lb (84.4 kg)   SpO2 97%   BMI 28.28 kg/m    Physical Exam Vitals and nursing note reviewed.  Constitutional:      General: She is not in acute distress.    Appearance: Normal appearance.  HENT:     Head: Normocephalic.     Right Ear: Tympanic membrane, ear canal and external ear normal.     Left Ear: Tympanic membrane, ear canal and external ear normal.  Eyes:     Conjunctiva/sclera: Conjunctivae normal.  Cardiovascular:     Rate and Rhythm: Normal rate and regular rhythm.      Pulses: Normal pulses.     Heart sounds: Normal heart sounds.  Pulmonary:     Effort: Pulmonary effort is normal.     Breath sounds: Normal breath sounds.  Musculoskeletal:        General: Tenderness (right anterior shin) present. No swelling.     Cervical back: Normal range of motion.     Comments: Generalized weakness, ambulating with a cane  Skin:    General: Skin is warm.     Findings: Bruising (arms and legs bilaterally) present.     Comments: Dry skin to legs  Neurological:     General: No focal deficit present.     Mental Status: She is alert and oriented to person, place, and time.  Psychiatric:        Mood and Affect: Mood normal.        Behavior: Behavior normal.        Thought Content: Thought content normal.        Judgment: Judgment normal.      Assessment & Plan:   Problem List Items Addressed This Visit       Cardiovascular and Mediastinum   Atrial fibrillation, unspecified type (HCC) - Primary   Chronic, stable. She is on flecainide  100 mg once daily and has declined further procedures and cardiology referral unless  issues arise. Continue flecainide  100 mg once daily.      Relevant Orders   CBC with Differential/Platelet   Comprehensive metabolic panel with GFR   TSH     Nervous and Auditory   Dementia without behavioral disturbance, psychotic disturbance, mood disturbance, or anxiety (HCC)   Chronic, stable. She continues to have some ongoing memory impairment. Her daughter and son have decided to move her to an Assisted Living Facility. Will complete forms and requirements for this. She declines medication at this point.         Musculoskeletal and Integument   Arthritis   Her right leg pain is sensitive to touch, with swelling in her feet and ankles. Continue tylenol  650mg  as needed for pain.       Osteopenia   Last T score -2.3 on 11/16/18. Recommend walking and making sure she gets plenty of calcium  through her diet or start a supplement.        Relevant Orders   VITAMIN D 25 Hydroxy (Vit-D Deficiency, Fractures)   Other Visit Diagnoses       Screening for cardiovascular condition       Screen lipid panel today   Relevant Orders   Lipid panel     Vitamin D insufficiency       Check vitamin D levels and treat based on results.   Relevant Orders   VITAMIN D 25 Hydroxy (Vit-D Deficiency, Fractures)     Screening for tuberculosis       Screen TB for ALF requirements   Relevant Orders   PPD (Completed)     Dry skin       She has dry skin in her ears. Apply baby oil or mineral oil to ears using a cotton ball.      Return in about 6 months (around 06/14/2024) for follow-up.    Tinnie DELENA Harada, NP

## 2023-12-16 NOTE — Addendum Note (Signed)
 Addended by: Audrionna Lampton A on: 12/16/2023 04:53 PM   Modules accepted: Orders

## 2023-12-16 NOTE — Patient Instructions (Signed)
 It was great to see you!  We are checking your labs today and will let you know the results via mychart/phone.   I will fill out the forms   Place a cotton ball soaked with baby or mineral oil in the ear to help with dryness and itching   Let's follow-up in 6 months, sooner if you have concerns.  If a referral was placed today, you will be contacted for an appointment. Please note that routine referrals can sometimes take up to 3-4 weeks to process. Please call our office if you haven't heard anything after this time frame.  Take care,  Tinnie Harada, NP

## 2023-12-16 NOTE — Assessment & Plan Note (Signed)
 Chronic, stable. She is on flecainide  100 mg once daily and has declined further procedures and cardiology referral unless issues arise. Continue flecainide  100 mg once daily.

## 2023-12-16 NOTE — Assessment & Plan Note (Signed)
Last T score -2.3 on 11/16/18. Recommend walking and making sure she gets plenty of calcium through her diet or start a supplement.

## 2023-12-16 NOTE — Assessment & Plan Note (Signed)
 Her right leg pain is sensitive to touch, with swelling in her feet and ankles. Continue tylenol  650mg  as needed for pain.

## 2023-12-17 ENCOUNTER — Encounter: Payer: Self-pay | Admitting: Nurse Practitioner

## 2023-12-17 ENCOUNTER — Other Ambulatory Visit: Payer: Self-pay | Admitting: Nurse Practitioner

## 2023-12-17 MED ORDER — VITAMIN D3 50 MCG (2000 UT) PO CAPS: 2000.0000 [IU] | ORAL_CAPSULE | Freq: Every day | ORAL | Status: AC

## 2023-12-18 ENCOUNTER — Ambulatory Visit: Admitting: Nurse Practitioner

## 2023-12-18 ENCOUNTER — Ambulatory Visit

## 2023-12-18 ENCOUNTER — Encounter: Payer: Self-pay | Admitting: Nurse Practitioner

## 2023-12-18 DIAGNOSIS — H1032 Unspecified acute conjunctivitis, left eye: Secondary | ICD-10-CM | POA: Diagnosis not present

## 2023-12-18 LAB — TB SKIN TEST
Induration: 0 mm
TB Skin Test: NEGATIVE

## 2023-12-18 MED ORDER — POLYMYXIN B-TRIMETHOPRIM 10000-0.1 UNIT/ML-% OP SOLN: 1.0000 [drp] | OPHTHALMIC | 0 refills | Status: AC

## 2023-12-18 NOTE — Progress Notes (Signed)
   Acute Office Visit  Subjective:     Patient ID: [name] Dodson, female    DOB: [birth year], 78 y.o.   MRN: [id number]  Chief Complaint  Patient presents with   Eye Problem    Concerns with left eye redness since this morning    HPI Patient is in today for redness in left eye along with itching.   She notes that she woke up this morning with redness, draining and itching in her left eye. She denies changes in vision and eye pain. She denies sick contacts, sore throat, nasal congestion. No current symptoms in right eye.   ROS See pertinent positives and negatives per HPI.      Objective:    There were no vitals taken for this visit.   Physical Exam Vitals and nursing note reviewed.  Constitutional:      General: She is not in acute distress.    Appearance: Normal appearance.  HENT:     Head: Normocephalic.  Eyes:     Conjunctiva/sclera:     Right eye: Right conjunctiva is not injected. No exudate.    Left eye: Left conjunctiva is injected. Exudate present.  Pulmonary:     Effort: Pulmonary effort is normal.  Musculoskeletal:     Cervical back: Normal range of motion.  Skin:    General: Skin is warm.  Neurological:     General: No focal deficit present.     Mental Status: She is alert and oriented to person, place, and time.  Psychiatric:        Mood and Affect: Mood normal.        Behavior: Behavior normal.        Thought Content: Thought content normal.        Judgment: Judgment normal.      Assessment & Plan:   Problem List Items Addressed This Visit   None Visit Diagnoses       Acute bacterial conjunctivitis of left eye    -  Primary   Start polytrim eye drops in left eye 4 times a day while awake. Wash hands frequently, avoid touching eyes. Use cool compresses as needed for symptom relief.       Meds ordered this encounter  Medications   trimethoprim -polymyxin b (POLYTRIM) ophthalmic solution    Sig: Place 1 drop into the left eye every 4 (four)  hours.    Dispense:  10 mL    Refill:  0    Return if symptoms worsen or fail to improve.  Tinnie DELENA Harada, NP

## 2023-12-18 NOTE — Patient Instructions (Signed)
 It was great to see you!  Start eye drops every 4 hours while awake for 7 days   Wash your hands frequently  You can use warm or cool compresses for itching   Let's follow-up if symptoms worsen or any concerns  Take care,  Tinnie Harada, NP

## 2024-01-08 ENCOUNTER — Other Ambulatory Visit: Payer: Self-pay | Admitting: Nurse Practitioner

## 2024-01-08 NOTE — Telephone Encounter (Signed)
 Requesting: IBUPROFEN  600 MG TABLET  Last Visit: 12/18/2023 Next Visit: Visit date not found Last Refill: 10/12/2023  Please Advise
